# Patient Record
Sex: Male | Born: 1970 | Race: Black or African American | Hispanic: No | Marital: Married | Smoking: Never smoker
Health system: Southern US, Community
[De-identification: ages and names within clinical notes are randomized; demographics above are authoritative.]

## PROBLEM LIST (undated history)

## (undated) DIAGNOSIS — Z8669 Personal history of other diseases of the nervous system and sense organs: Secondary | ICD-10-CM

## (undated) DIAGNOSIS — J45909 Unspecified asthma, uncomplicated: Secondary | ICD-10-CM

## (undated) DIAGNOSIS — N201 Calculus of ureter: Secondary | ICD-10-CM

## (undated) DIAGNOSIS — N289 Disorder of kidney and ureter, unspecified: Secondary | ICD-10-CM

## (undated) DIAGNOSIS — Z973 Presence of spectacles and contact lenses: Secondary | ICD-10-CM

## (undated) HISTORY — DX: Presence of spectacles and contact lenses: Z97.3

## (undated) HISTORY — DX: Unspecified asthma, uncomplicated: J45.909

---

## 1994-03-01 HISTORY — PX: NASAL FRACTURE SURGERY: SHX718

## 2005-05-09 ENCOUNTER — Emergency Department (HOSPITAL_COMMUNITY): Admission: EM | Admit: 2005-05-09 | Discharge: 2005-05-09 | Payer: Self-pay | Admitting: Family Medicine

## 2008-04-19 ENCOUNTER — Ambulatory Visit: Payer: Self-pay | Admitting: Family Medicine

## 2009-02-24 ENCOUNTER — Ambulatory Visit: Payer: Self-pay | Admitting: Family Medicine

## 2010-07-20 ENCOUNTER — Ambulatory Visit: Payer: Self-pay | Admitting: Family Medicine

## 2010-07-21 ENCOUNTER — Ambulatory Visit (INDEPENDENT_AMBULATORY_CARE_PROVIDER_SITE_OTHER): Payer: BC Managed Care – PPO | Admitting: Family Medicine

## 2010-07-21 ENCOUNTER — Encounter: Payer: Self-pay | Admitting: Family Medicine

## 2010-07-21 DIAGNOSIS — J45909 Unspecified asthma, uncomplicated: Secondary | ICD-10-CM

## 2010-07-21 DIAGNOSIS — J309 Allergic rhinitis, unspecified: Secondary | ICD-10-CM

## 2010-07-21 MED ORDER — ALBUTEROL 90 MCG/ACT IN AERS: 2.0000 | INHALATION_SPRAY | Freq: Four times a day (QID) | RESPIRATORY_TRACT | Status: DC | PRN

## 2010-07-21 NOTE — Patient Instructions (Signed)
use the inhaler as needed . If you keep having trouble, return here for reevaluation

## 2010-07-21 NOTE — Progress Notes (Signed)
  Subjective:    Patient ID: [name], male    DOB: 1970/04/24, 40 y.o.   MRN: [id number]  HPI approximately 2 months ago he started having trouble with sneezing, itchy watery eyes, runny nose occurs every spring and fall. He started having difficulty with coughing shortly after that and noted occasional wheezing. No fever or chills or productive cough. He does not smoke    Review of Systems     Objective:   Physical Exam  Constitutional: No distress.  HENT:  Right Ear: External ear normal.  Left Ear: External ear normal.  Nose: Nose normal.  Mouth/Throat: Oropharynx is clear and moist. No oropharyngeal exudate.  Neck: Neck supple. No thyromegaly present.  Cardiovascular: Normal rate, regular rhythm and normal heart sounds.  Exam reveals no gallop and no friction rub.   No murmur heard. Pulmonary/Chest: Breath sounds normal. No respiratory distress. He has no wheezes. He has no rales.  Lymphadenopathy:    He has no cervical adenopathy.   At the end of the encounter he then mentioned difficulty with questionable scrotal lesion. Exam of the penis, testes and scrotum with normal       Assessment & Plan:  Allergic rhinitis. Asthma. Proventil for the asthma. Return here if further trouble

## 2011-06-03 ENCOUNTER — Encounter: Payer: Self-pay | Admitting: Internal Medicine

## 2011-06-04 ENCOUNTER — Encounter: Payer: Self-pay | Admitting: Medical

## 2011-06-04 ENCOUNTER — Ambulatory Visit (INDEPENDENT_AMBULATORY_CARE_PROVIDER_SITE_OTHER): Payer: BC Managed Care – PPO | Admitting: Medical

## 2011-06-04 VITALS — BP 112/70 | HR 92 | Temp 99.1°F | Resp 16 | Wt 159.0 lb

## 2011-06-04 DIAGNOSIS — J329 Chronic sinusitis, unspecified: Secondary | ICD-10-CM

## 2011-06-04 MED ORDER — AMOXICILLIN 875 MG PO TABS: 875.0000 mg | ORAL_TABLET | Freq: Two times a day (BID) | ORAL | Status: AC

## 2011-06-04 NOTE — Patient Instructions (Signed)

## 2011-06-04 NOTE — Progress Notes (Signed)
Subjective:  [name] is a 41 y.o. male who presents for possible sinus infection.  Symptoms began 5 days ago with head congestion, fever, ear pain, colored drainage, some cough, using Tylenol for symptoms.  No sick contacts.   Patient is a non-smoker.  No other aggravating or relieving factors.  No other c/o.  Past Medical History  Diagnosis Date  . Elevated lipids   . Allergic rhinitis    ROS as noted in HPI  Objective:   Filed Vitals:   06/04/11 1109  BP: 112/70  Pulse: 92  Temp: 99.1 F (37.3 C)  Resp: 16    General appearance: Alert, WD/WN, no distress                             Skin: warm, no rash                           Head: + frontal sinus tenderness,                            Eyes: conjunctiva normal, corneas clear, PERRLA                            Ears: pearly TMs, external ear canals normal                          Nose: septum midline, turbinates swollen, with erythema and clear discharge             Mouth/throat: MMM, tongue normal, mild pharyngeal erythema                           Neck: supple, no adenopathy, no thyromegaly, nontender                          Heart: RRR, normal S1, S2, no murmurs                         Lungs: CTA bilaterally, no wheezes, rales, or rhonchi      Assessment and Plan:   Encounter Diagnosis  Name Primary?  . Sinusitis Yes   Prescription given for Amoxicillin.  Can use OTC Mucinex DM for congestion.  Tylenol or Ibuprofen OTC for fever and malaise.  Discussed symptomatic relief, nasal saline, and call or return if worse or not improving in 2-3 days.

## 2011-06-05 ENCOUNTER — Encounter: Payer: Self-pay | Admitting: Medical

## 2011-06-08 ENCOUNTER — Ambulatory Visit (INDEPENDENT_AMBULATORY_CARE_PROVIDER_SITE_OTHER): Payer: BC Managed Care – PPO | Admitting: Medical

## 2011-06-08 ENCOUNTER — Encounter: Payer: Self-pay | Admitting: Medical

## 2011-06-08 ENCOUNTER — Other Ambulatory Visit: Payer: Self-pay | Admitting: Medical

## 2011-06-08 VITALS — BP 112/82 | HR 68 | Temp 98.4°F | Resp 16 | Ht 64.0 in | Wt 157.0 lb

## 2011-06-08 DIAGNOSIS — Z23 Encounter for immunization: Secondary | ICD-10-CM

## 2011-06-08 DIAGNOSIS — Z125 Encounter for screening for malignant neoplasm of prostate: Secondary | ICD-10-CM

## 2011-06-08 DIAGNOSIS — R3129 Other microscopic hematuria: Secondary | ICD-10-CM

## 2011-06-08 DIAGNOSIS — Z0000 Encounter for general adult medical examination without abnormal findings: Secondary | ICD-10-CM

## 2011-06-08 DIAGNOSIS — J309 Allergic rhinitis, unspecified: Secondary | ICD-10-CM

## 2011-06-08 LAB — POCT URINALYSIS DIPSTICK
Bilirubin, UA: NEGATIVE
Glucose, UA: NEGATIVE
Ketones, UA: NEGATIVE
Spec Grav, UA: 1.015
pH, UA: 5

## 2011-06-08 LAB — COMPREHENSIVE METABOLIC PANEL
AST: 27 U/L (ref 0–37)
Albumin: 4.2 g/dL (ref 3.5–5.2)
BUN: 12 mg/dL (ref 6–23)
CO2: 27 mEq/L (ref 19–32)
Calcium: 8.8 mg/dL (ref 8.4–10.5)
Chloride: 104 mEq/L (ref 96–112)
Creat: 1.12 mg/dL (ref 0.50–1.35)
Glucose, Bld: 112 mg/dL — ABNORMAL HIGH (ref 70–99)
Potassium: 3.9 mEq/L (ref 3.5–5.3)

## 2011-06-08 LAB — CBC WITH DIFFERENTIAL/PLATELET
Basophils Relative: 0 % (ref 0–1)
HCT: 47.6 % (ref 39.0–52.0)
Hemoglobin: 16.7 g/dL (ref 13.0–17.0)
Lymphocytes Relative: 28 % (ref 12–46)
MCHC: 35.1 g/dL (ref 30.0–36.0)
Monocytes Relative: 5 % (ref 3–12)
Neutro Abs: 3 10*3/uL (ref 1.7–7.7)
Neutrophils Relative %: 65 % (ref 43–77)
RBC: 6.45 MIL/uL — ABNORMAL HIGH (ref 4.22–5.81)
WBC: 4.6 10*3/uL (ref 4.0–10.5)

## 2011-06-08 LAB — LIPID PANEL
Cholesterol: 184 mg/dL (ref 0–200)
HDL: 30 mg/dL — ABNORMAL LOW (ref >39)
Triglycerides: 188 mg/dL — ABNORMAL HIGH (ref <150)
VLDL: 38 mg/dL (ref 0–40)

## 2011-06-08 NOTE — Progress Notes (Signed)
Subjective:   HPI  [name] is a 41 y.o. male who presents for a complete physical.  I saw him last week for sinusitis and he is improving.   He has been in his normal state of health otherwise without c/o.  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   Last opthalmology visit 8months ago Last dental visit 5-6 years ago Last tdap unknown Declines flu shot  Reviewed their medical, surgical, family, social, medication, and allergy history and updated chart as appropriate.  Past Medical History  Diagnosis Date  . Allergic rhinitis   . Allergic asthma   . Wears glasses     Past Surgical History  Procedure Date  . Nasal fracture surgery     Family History  Problem Relation Age of Onset  . Diabetes Mother   . Diabetes Father   . Hypertension Father   . Other Father     chemical exposure in [place]  . Stroke Neg Hx   . Heart disease Neg Hx   . Cancer Neg Hx   . Hyperlipidemia Neg Hx     History   Social History  . Marital Status: Married    Spouse Name: N/A    Number of Children: N/A  . Years of Education: N/A   Occupational History  . social studies teacher grade 7 [employer]   Social History Main Topics  . Smoking status: Never Smoker   . Smokeless tobacco: Never Used  . Alcohol Use: No  . Drug Use: No  . Sexually Active: Not on file   Other Topics Concern  . Not on file   Social History Narrative   Married, 4 children, exercise some with walking and stretching    Current Outpatient Prescriptions on File Prior to Visit  Medication Sig Dispense Refill  . albuterol (PROVENTIL) 90 MCG/ACT inhaler Inhale 2 puffs into the lungs every 6 (six) hours as needed for wheezing.  17 g  0  . amoxicillin (AMOXIL) 875 MG tablet Take 1 tablet (875 mg total) by mouth 2 (two) times daily.  20 tablet  0    No Known Allergies  Review of Systems Constitutional: -fever, -chills, -sweats, -unexpected weight change, -anorexia, -fatigue Allergy:  -sneezing, -itching, -congestion Dermatology: denies changing moles, rash, lumps, new worrisome lesions ENT: -runny nose, -ear pain, -sore throat, -hoarseness, -sinus pain, -teeth pain, -tinnitus, -hearing loss, -epistaxis Cardiology:  -chest pain, -palpitations, -edema, -orthopnea, -paroxysmal nocturnal dyspnea Respiratory: -cough, -shortness of breath, -dyspnea on exertion, -wheezing, -hemoptysis Gastroenterology: -abdominal pain, -nausea, -vomiting, -diarrhea, -constipation, -blood in stool, -changes in bowel movement, -dysphagia Hematology: -bleeding or bruising problems Musculoskeletal: -arthralgias, -myalgias, -joint swelling, -back pain, -neck pain, -cramping, -gait changes Ophthalmology: -vision changes, -eye redness, -itching, -discharge Urology: -dysuria, -difficulty urinating, -hematuria, -urinary frequency, -urgency, incontinence Neurology: -headache, -weakness, -tingling, -numbness, -speech abnormality, -memory loss, -falls, -dizziness Psychology:  -depressed mood, -agitation, -sleep problems     Objective:   Physical Exam  Filed Vitals:   06/08/11 0804  BP: 112/82  Pulse: 68  Temp: 98.4 F (36.9 C)  Resp: 16    General appearance: alert, no distress, WD/WN, black male Skin: few scatterd benign appearing macules, no worrisome lesions HEENT: normocephalic, conjunctiva/corneas normal, sclerae anicteric, PERRLA, EOMi, nares patent, no discharge or erythema, pharynx normal Oral cavity: MMM, tongue normal, teeth in good repair Neck: supple, no lymphadenopathy, no thyromegaly, no masses, normal ROM, no bruits Chest: non tender, normal shape and expansion Heart: RRR, normal S1, S2, no  murmurs Lungs: CTA bilaterally, no wheezes, rhonchi, or rales Abdomen: +bs, soft, non tender, non distended, no masses, no hepatomegaly, no splenomegaly, no bruits Back: non tender, normal ROM, no scoliosis Musculoskeletal: upper extremities non tender, no obvious deformity, normal ROM  throughout, lower extremities non tender, no obvious deformity, normal ROM throughout Extremities: no edema, no cyanosis, no clubbing Pulses: 2+ symmetric, upper and lower extremities, normal cap refill Neurological: alert, oriented x 3, CN2-12 intact, strength normal upper extremities and lower extremities, sensation normal throughout, DTRs 2+ throughout, no cerebellar signs, gait normal Psychiatric: normal affect, behavior normal, pleasant  GU: normal male external genitalia, nontender, no masses, no hernia, no lymphadenopathy Rectal: anus with small external hemorrhoid, unable to get good palpation of prostate due to patient guarding   Assessment and Plan :    Encounter Diagnoses  Name Primary?  . Routine general medical examination at a health care facility Yes  . Screening PSA (prostate specific antigen)   . Need for Tdap vaccination   . Allergic rhinitis   . Microscopic hematuria    Physical exam - healthy appearing.  Discussed healthy lifestyle, diet, exercise, preventative care, vaccinations, and addressed their concerns.  Handout given.  PSA screening today.  Discussed risks/benefits of testing.  Tdap given today along with VIS and vaccine counseling.    Allergic rhinitis - begin OTC Zyrtec 10mg  QHS  Microscopic hematuria - recheck 2 wk first morning urine.  Follow-up pending labs.

## 2011-06-08 NOTE — Patient Instructions (Signed)
Preventative Care for Adults, Male       REGULAR HEALTH EXAMS:  A routine yearly physical is a good way to check in with your primary care provider about your health and preventive screening. It is also an opportunity to share updates about your health and any concerns you have, and receive a thorough all-over exam.   Most health insurance companies pay for at least some preventative services.  Check with your health plan for specific coverages.  WHAT PREVENTATIVE SERVICES DO MEN NEED?  Adult men should have their weight and blood pressure checked regularly.   Men age 35 and older should have their cholesterol levels checked regularly.  Beginning at age 50 and continuing to age 75, men should be screened for colorectal cancer.  Certain people should may need continued testing until age 85.  Other cancer screening may include exams for testicular and prostate cancer.  Updating vaccinations is part of preventative care.  Vaccinations help protect against diseases such as the flu.  Lab tests are generally done as part of preventative care to screen for anemia and blood disorders, to screen for problems with the kidneys and liver, to screen for bladder problems, to check blood sugar, and to check your cholesterol level.  Preventative services generally include counseling about diet, exercise, avoiding tobacco, drugs, excessive alcohol consumption, and sexually transmitted infections.    GENERAL RECOMMENDATIONS FOR GOOD HEALTH:  Healthy diet:  Eat a variety of foods, including fruit, vegetables, animal or vegetable protein, such as meat, fish, chicken, and eggs, or beans, lentils, tofu, and grains, such as rice.  Drink plenty of water daily.  Decrease saturated fat in the diet, avoid lots of red meat, processed foods, sweets, fast foods, and fried foods.  Exercise:  Aerobic exercise helps maintain good heart health. At least 30-40 minutes of moderate-intensity exercise is recommended.  For example, a brisk walk that increases your heart rate and breathing. This should be done on most days of the week.   Find a type of exercise or a variety of exercises that you enjoy so that it becomes a part of your daily life.  Examples are running, walking, swimming, water aerobics, and biking.  For motivation and support, explore group exercise such as aerobic class, spin class, Zumba, Yoga,or  martial arts, etc.    Set exercise goals for yourself, such as a certain weight goal, walk or run in a race such as a 5k walk/run.  Speak to your primary care provider about exercise goals.  Disease prevention:  If you smoke or chew tobacco, find out from your caregiver how to quit. It can literally save your life, no matter how long you have been a tobacco user. If you do not use tobacco, never begin.   Maintain a healthy diet and normal weight. Increased weight leads to problems with blood pressure and diabetes.   The Body Mass Index or BMI is a way of measuring how much of your body is fat. Having a BMI above 27 increases the risk of heart disease, diabetes, hypertension, stroke and other problems related to obesity. Your caregiver can help determine your BMI and based on it develop an exercise and dietary program to help you achieve or maintain this important measurement at a healthful level.  High blood pressure causes heart and blood vessel problems.  Persistent high blood pressure should be treated with medicine if weight loss and exercise do not work.   Fat and cholesterol leaves deposits in your arteries   that can block them. This causes heart disease and vessel disease elsewhere in your body.  If your cholesterol is found to be high, or if you have heart disease or certain other medical conditions, then you may need to have your cholesterol monitored frequently and be treated with medication.   Ask if you should have a stress test if your history suggests this. A stress test is a test done on  a treadmill that looks for heart disease. This test can find disease prior to there being a problem.  Avoid drinking alcohol in excess (more than two drinks per day).  Avoid use of street drugs. Do not share needles with anyone. Ask for professional help if you need assistance or instructions on stopping the use of alcohol, cigarettes, and/or drugs.  Brush your teeth twice a day with fluoride toothpaste, and floss once a day. Good oral hygiene prevents tooth decay and gum disease. The problems can be painful, unattractive, and can cause other health problems. Visit your dentist for a routine oral and dental check up and preventive care every 6-12 months.   Look at your skin regularly.  Use a mirror to look at your back. Notify your caregivers of changes in moles, especially if there are changes in shapes, colors, a size larger than a pencil eraser, an irregular border, or development of new moles.  Safety:  Use seatbelts 100% of the time, whether driving or as a passenger.  Use safety devices such as hearing protection if you work in environments with loud noise or significant background noise.  Use safety glasses when doing any work that could send debris in to the eyes.  Use a helmet if you ride a bike or motorcycle.  Use appropriate safety gear for contact sports.  Talk to your caregiver about gun safety.  Use sunscreen with a SPF (or skin protection factor) of 15 or greater.  Lighter skinned people are at a greater risk of skin cancer. Don't forget to also wear sunglasses in order to protect your eyes from too much damaging sunlight. Damaging sunlight can accelerate cataract formation.   Practice safe sex. Use condoms. Condoms are used for birth control and to help reduce the spread of sexually transmitted infections (or STIs).  Some of the STIs are gonorrhea (the clap), chlamydia, syphilis, trichomonas, herpes, HPV (human papilloma virus) and HIV (human immunodeficiency virus) which causes AIDS.  The herpes, HIV and HPV are viral illnesses that have no cure. These can result in disability, cancer and death.   Keep carbon monoxide and smoke detectors in your home functioning at all times. Change the batteries every 6 months or use a model that plugs into the wall.   Vaccinations:  Stay up to date with your tetanus shots and other required immunizations. You should have a booster for tetanus every 10 years. Be sure to get your flu shot every year, since 5%-20% of the [place] population comes down with the flu. The flu vaccine changes each year, so being vaccinated once is not enough. Get your shot in the fall, before the flu season peaks.   Other vaccines to consider:  Pneumococcal vaccine to protect against certain types of pneumonia.  This is normally recommended for adults age 65 or older.  However, adults younger than 42 years old with certain underlying conditions such as diabetes, heart or lung disease should also receive the vaccine.  Shingles vaccine to protect against Varicella Zoster if you are older than age 60, or younger   than 42 years old with certain underlying illness.  Hepatitis A vaccine to protect against a form of infection of the liver by a virus acquired from food.  Hepatitis B vaccine to protect against a form of infection of the liver by a virus acquired from blood or body fluids, particularly if you work in health care.  If you plan to travel internationally, check with your local health department for specific vaccination recommendations.  Cancer Screening:  Most routine colon cancer screening begins at the age of 50. On a yearly basis, doctors may provide special easy to use take-home tests to check for hidden blood in the stool. Sigmoidoscopy or colonoscopy can detect the earliest forms of colon cancer and is life saving. These tests use a small camera at the end of a tube to directly examine the colon. Speak to your caregiver about this at age 50, when routine  screening begins (and is repeated every 5 years unless early forms of pre-cancerous polyps or small growths are found).   At the age of 50 men usually start screening for prostate cancer every year. Screening may begin at a younger age for those with higher risk. Those at higher risk include African-Americans or having a family history of prostate cancer. There are two types of tests for prostate cancer:   Prostate-specific antigen (PSA) testing. Recent studies raise questions about prostate cancer using PSA and you should discuss this with your caregiver.   Digital rectal exam (in which your doctor's lubricated and gloved finger feels for enlargement of the prostate through the anus).   Screening for testicular cancer.  Do a monthly exam of your testicles. Gently roll each testicle between your thumb and fingers, feeling for any abnormal lumps. The best time to do this is after a hot shower or bath when the tissues are looser. Notify your caregivers of any lumps, tenderness or changes in size or shape immediately.     

## 2011-06-09 LAB — HEMOGLOBIN A1C: Mean Plasma Glucose: 117 mg/dL — ABNORMAL HIGH (ref <117)

## 2011-06-22 ENCOUNTER — Other Ambulatory Visit (INDEPENDENT_AMBULATORY_CARE_PROVIDER_SITE_OTHER): Payer: BC Managed Care – PPO

## 2011-06-22 DIAGNOSIS — R319 Hematuria, unspecified: Secondary | ICD-10-CM

## 2011-06-22 LAB — POCT URINALYSIS DIPSTICK
Bilirubin, UA: NEGATIVE
Blood, UA: 250
Glucose, UA: NEGATIVE
Ketones, UA: NEGATIVE
Leukocytes, UA: NEGATIVE
Nitrite, UA: NEGATIVE

## 2013-07-17 ENCOUNTER — Encounter: Payer: Self-pay | Admitting: Medical

## 2013-07-18 ENCOUNTER — Encounter: Payer: Self-pay | Admitting: Family Medicine

## 2015-03-02 DIAGNOSIS — N201 Calculus of ureter: Secondary | ICD-10-CM

## 2015-03-02 HISTORY — DX: Calculus of ureter: N20.1

## 2015-05-27 ENCOUNTER — Encounter: Payer: Self-pay | Admitting: Family Medicine

## 2015-05-27 ENCOUNTER — Ambulatory Visit (INDEPENDENT_AMBULATORY_CARE_PROVIDER_SITE_OTHER): Payer: BC Managed Care – PPO | Admitting: Family Medicine

## 2015-05-27 VITALS — BP 112/76 | HR 68 | Resp 12 | Ht 64.0 in | Wt 162.4 lb

## 2015-05-27 DIAGNOSIS — J4520 Mild intermittent asthma, uncomplicated: Secondary | ICD-10-CM | POA: Diagnosis not present

## 2015-05-27 DIAGNOSIS — Z0000 Encounter for general adult medical examination without abnormal findings: Secondary | ICD-10-CM

## 2015-05-27 DIAGNOSIS — J301 Allergic rhinitis due to pollen: Secondary | ICD-10-CM

## 2015-05-27 LAB — POCT URINALYSIS DIP (MANUAL ENTRY)
Bilirubin, UA: NEGATIVE
Glucose, UA: NEGATIVE
Ketones, POC UA: NEGATIVE
Leukocytes, UA: NEGATIVE
Nitrite, UA: NEGATIVE
PROTEIN UA: NEGATIVE
UROBILINOGEN UA: 0.2
pH, UA: 5.5

## 2015-05-27 LAB — CBC WITH DIFFERENTIAL/PLATELET
BASOS PCT: 0 % (ref 0–1)
Basophils Absolute: 0 10*3/uL (ref 0.0–0.1)
Eosinophils Absolute: 0.2 10*3/uL (ref 0.0–0.7)
Eosinophils Relative: 3 % (ref 0–5)
HEMATOCRIT: 49 % (ref 39.0–52.0)
HEMOGLOBIN: 17.6 g/dL — AB (ref 13.0–17.0)
LYMPHS PCT: 30 % (ref 12–46)
Lymphs Abs: 1.7 10*3/uL (ref 0.7–4.0)
MCH: 27.2 pg (ref 26.0–34.0)
MCHC: 35.9 g/dL (ref 30.0–36.0)
MCV: 75.7 fL — ABNORMAL LOW (ref 78.0–100.0)
MONO ABS: 0.5 10*3/uL (ref 0.1–1.0)
MONOS PCT: 9 % (ref 3–12)
MPV: 9.4 fL (ref 8.6–12.4)
NEUTROS ABS: 3.2 10*3/uL (ref 1.7–7.7)
NEUTROS PCT: 58 % (ref 43–77)
Platelets: 236 10*3/uL (ref 150–400)
RBC: 6.47 MIL/uL — ABNORMAL HIGH (ref 4.22–5.81)
RDW: 14 % (ref 11.5–15.5)
WBC: 5.6 10*3/uL (ref 4.0–10.5)

## 2015-05-27 NOTE — Progress Notes (Signed)
   Subjective:    Patient ID: [name], male    DOB: 03/30/1970, 45 y.o.   MRN: [id number]  HPI He is here for complete examination. He does have underlying allergies and has them under good control. He has remote history of difficulty with asthma and rarely uses albuterol. He has no other concerns or complaints. 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 They very much enjoy this. He does need paperwork filled out for this. Family and social history as well as health maintenance and immunizations were reviewed.   Review of Systems  All other systems reviewed and are negative.      Objective:   Physical Exam BP 112/76 mmHg  Pulse 68  Resp 12  Ht 5\' 4"  (1.626 m)  Wt 162 lb 6.4 oz (73.664 kg)  BMI 27.86 kg/m2  General Appearance:    Alert, cooperative, no distress, appears stated age  Head:    Normocephalic, without obvious abnormality, atraumatic  Eyes:    PERRL, conjunctiva/corneas clear, EOM's intact, fundi    benign  Ears:    Normal TM's and external ear canals  Nose:   Nares normal, mucosa normal, no drainage or sinus   tenderness  Throat:   Lips, mucosa, and tongue normal; teeth and gums normal  Neck:   Supple, no lymphadenopathy;  thyroid:  no   enlargement/tenderness/nodules; no carotid   bruit or JVD  Back:    Spine nontender, no curvature, ROM normal, no CVA     tenderness  Lungs:     Clear to auscultation bilaterally without wheezes, rales or     ronchi; respirations unlabored  Chest Wall:    No tenderness or deformity   Heart:    Regular rate and rhythm, S1 and S2 normal, no murmur, rub   or gallop     Abdomen:     Soft, non-tender, nondistended, normoactive bowel sounds,    no masses, no hepatosplenomegaly  Genitalia:    Normal male external genitalia without lesions.  Testicles without masses.  No inguinal hernias.     Extremities:   No clubbing, cyanosis or edema    Pulses:   2+ and symmetric all extremities  Skin:   Skin color, texture, turgor normal, no rashes or lesions  Lymph nodes:   Cervical, supraclavicular, and axillary nodes normal  Neurologic:   CNII-XII intact, normal strength, sensation and gait; reflexes 2+ and symmetric throughout          Psych:   Normal mood, affect, hygiene and grooming.          Assessment & Plan:  Annual physical exam - Plan: POCT urinalysis dipstick, CBC with Differential/Platelet, Comprehensive metabolic panel, Lipid panel  Allergic rhinitis due to pollen  Asthma, mild intermittent, uncomplicated increased him to continue to take good care of himself. Discussed the use of albuterol in the future. He rarely uses this though I do not see this as a problem.

## 2015-05-28 LAB — LIPID PANEL
CHOLESTEROL: 221 mg/dL — AB (ref 125–200)
HDL: 41 mg/dL (ref >=40)
LDL Cholesterol: 153 mg/dL — ABNORMAL HIGH (ref <130)
TRIGLYCERIDES: 133 mg/dL (ref <150)
Total CHOL/HDL Ratio: 5.4 Ratio — ABNORMAL HIGH (ref <=5.0)
VLDL: 27 mg/dL (ref <30)

## 2015-05-28 LAB — COMPREHENSIVE METABOLIC PANEL
ALBUMIN: 4.2 g/dL (ref 3.6–5.1)
ALT: 19 U/L (ref 9–46)
AST: 18 U/L (ref 10–40)
Alkaline Phosphatase: 83 U/L (ref 40–115)
BILIRUBIN TOTAL: 0.8 mg/dL (ref 0.2–1.2)
BUN: 10 mg/dL (ref 7–25)
CALCIUM: 9.1 mg/dL (ref 8.6–10.3)
CHLORIDE: 106 mmol/L (ref 98–110)
CO2: 25 mmol/L (ref 20–31)
CREATININE: 1.37 mg/dL — AB (ref 0.60–1.35)
Glucose, Bld: 79 mg/dL (ref 65–99)
Potassium: 4.1 mmol/L (ref 3.5–5.3)
SODIUM: 139 mmol/L (ref 135–146)
TOTAL PROTEIN: 7.2 g/dL (ref 6.1–8.1)

## 2016-01-01 ENCOUNTER — Emergency Department (HOSPITAL_COMMUNITY)
Admission: EM | Admit: 2016-01-01 | Discharge: 2016-01-01 | Disposition: A | Discharge status: Home / Self Care | Payer: BC Managed Care – PPO | Attending: Emergency Medicine | Admitting: Emergency Medicine

## 2016-01-01 ENCOUNTER — Emergency Department (HOSPITAL_COMMUNITY): Payer: BC Managed Care – PPO

## 2016-01-01 ENCOUNTER — Encounter (HOSPITAL_COMMUNITY): Payer: Self-pay

## 2016-01-01 DIAGNOSIS — J45909 Unspecified asthma, uncomplicated: Secondary | ICD-10-CM | POA: Insufficient documentation

## 2016-01-01 DIAGNOSIS — K5900 Constipation, unspecified: Secondary | ICD-10-CM | POA: Diagnosis not present

## 2016-01-01 DIAGNOSIS — R109 Unspecified abdominal pain: Secondary | ICD-10-CM

## 2016-01-01 DIAGNOSIS — R1032 Left lower quadrant pain: Secondary | ICD-10-CM | POA: Diagnosis present

## 2016-01-01 MED ORDER — FLEET ENEMA 7-19 GM/118ML RE ENEM
1.0000 | ENEMA | Freq: Once | RECTAL | Status: AC
  Administered 2016-01-01: 1 via RECTAL
  Filled 2016-01-01: qty 1

## 2016-01-01 MED ORDER — IBUPROFEN 800 MG PO TABS
800.0000 mg | ORAL_TABLET | Freq: Once | ORAL | Status: AC
  Administered 2016-01-01: 800 mg via ORAL
  Filled 2016-01-01: qty 1

## 2016-01-01 MED ORDER — POLYETHYLENE GLYCOL 3350 17 G PO PACK: 17.0000 g | PACK | Freq: Every day | ORAL | 0 refills | Status: DC

## 2016-01-01 NOTE — Discharge Instructions (Signed)
Take miralax 2-3 times daily until you have a bowel movement then daily until your stool is soft for a week.   See your doctor. Consider see GI doctor if you are still constipated in a week.   Return to ER if you have fever, severe pain, testicular pain, vomiting, unable to have bowel movement for a week.

## 2016-01-01 NOTE — ED Triage Notes (Signed)
Pt c/o LLQ ABD pain x5 days that has gotten progressively worse. Pt describes pain as sharp. Pt cannot recall the last time he had a BM.

## 2016-01-01 NOTE — ED Provider Notes (Signed)
[facility]-EMERGENCY DEPT Provider Note   CSN: [id number] Arrival date & time: 01/01/16  0944     History   Chief Complaint Chief Complaint  Patient presents with  . Abdominal Pain    HPI [name] is a 45 y.o. male here with abdominal pain. Has been constipated for the last 5 days. Has progressive LLQ pain that is gradually getting worse. Denies any urinary symptoms or vomiting or fevers. Denies testicular pain. No hx of constipation.   The history is provided by the patient.    Past Medical History:  Diagnosis Date  . Allergic asthma   . Allergic rhinitis   . Wears glasses     Patient Active Problem List   Diagnosis Date Noted  . Allergic rhinitis 06/08/2011    Past Surgical History:  Procedure Laterality Date  . NASAL FRACTURE SURGERY         Home Medications    Prior to Admission medications   Medication Sig Start Date End Date Taking? Authorizing Provider  albuterol (PROVENTIL) 90 MCG/ACT inhaler Inhale 2 puffs into the lungs every 6 (six) hours as needed for wheezing. 07/21/10 07/16/11  Ronnald NianJohn C Lalonde, MD    Family History Family History  Problem Relation Age of Onset  . Diabetes Mother   . Diabetes Father   . Hypertension Father   . Other Father     chemical exposure in [place]  . Stroke Neg Hx   . Heart disease Neg Hx   . Cancer Neg Hx   . Hyperlipidemia Neg Hx     Social History Social History  Substance Use Topics  . Smoking status: Never Smoker  . Smokeless tobacco: Never Used  . Alcohol use No     Allergies   Pollen extract and Shellfish allergy   Review of Systems Review of Systems  Gastrointestinal: Positive for abdominal pain.  All other systems reviewed and are negative.    Physical Exam Updated Vital Signs BP 133/96 (BP Location: Right Arm)   Pulse 81   Temp 98 F (36.7 C) (Oral)   Resp 21   Ht 5\' 4"  (1.626 m)   Wt 160 lb (72.6 kg)   SpO2 97%   BMI 27.46 kg/m   Physical Exam  Constitutional: He appears  well-developed and well-nourished.  HENT:  Head: Normocephalic.  Eyes: EOM are normal.  Neck: Normal range of motion. Neck supple.  Cardiovascular: Normal rate, regular rhythm and normal heart sounds.   Pulmonary/Chest: Effort normal and breath sounds normal. No respiratory distress. He has no wheezes. He has no rales.  Abdominal: Soft. Bowel sounds are normal.  Mild LLQ tenderness, no rebound   Genitourinary:  Genitourinary Comments: Rectal- no obvious stool impaction. Testicles nontender   Musculoskeletal: Normal range of motion.  Neurological: He is alert.  Skin: Skin is warm.  Psychiatric: He has a normal mood and affect.  Nursing note and vitals reviewed.    ED Treatments / Results  Labs (all labs ordered are listed, but only abnormal results are displayed) Labs Reviewed - No data to display  EKG  EKG Interpretation  Date/Time:  Thursday January 01 2016 09:54:32 EDT Ventricular Rate:  80 PR Interval:    QRS Duration: 87 QT Interval:  372 QTC Calculation: 430 R Axis:   66 Text Interpretation:  Sinus rhythm Baseline wander in lead(s) V3 No previous ECGs available Confirmed by Latoiya Maradiaga  MD, Elbridge Magowan ([id number]) on 01/01/2016 9:58:54 AM       Radiology No results found.  Procedures Procedures (including critical care time)  Medications Ordered in ED Medications  sodium phosphate (FLEET) 7-19 GM/118ML enema 1 enema (not administered)  ibuprofen (ADVIL,MOTRIN) tablet 800 mg (not administered)     Initial Impression / Assessment and Plan / ED Course  I have reviewed the triage vital signs and the nursing notes.  Pertinent labs & imaging results that were available during my care of the patient were reviewed by me and considered in my medical decision making (see chart for details).  Clinical Course    [name] is a 45 y.o. male here with constipation, LLQ pain. Minimal LLQ tenderness. No fever or urinary symptoms or vomiting. Well appearing overall. Will get xray,  give enema.   11:59 AM Given enema but had no bowel movement. Xray showed constipation throughout likely causing his symptoms. Low suspicion for diverticulitis. Comfortable after given motrin. Recommend miralax, GI follow up.    Final Clinical Impressions(s) / ED Diagnoses   Final diagnoses:  Abdominal pain    New Prescriptions New Prescriptions   No medications on file     Charlynne Panderavid Hsienta Rita Prom, MD 01/01/16 1200

## 2016-01-02 ENCOUNTER — Telehealth: Payer: Self-pay

## 2016-01-02 NOTE — Telephone Encounter (Signed)
I reviewed the ED note, xray.  Sounds like he may just be backed up.  He could try saline enema OTC, but I would try Milk of Magnesia OTC as this usually will unstop someone  However, if significant abdominal pain, then get checked back out.  There was no sign of obstruction of bowel on the xray the other day

## 2016-01-02 NOTE — Telephone Encounter (Signed)
Pt's wife called back  Told her to try the saline  Enema , the milk magnesia  To see if that will help , she was concern if he wouldn't will know work .she was told per shane call go to hospital , or call the afterhours line, also asked about his back issues, pt is not having any numbness in his legs, and had not taking any pain meds for his back .and follow up

## 2016-01-02 NOTE — Telephone Encounter (Signed)
Pt 's wife and said that he has not had BM since last week and he when to the ER on Thursday 01/01/2016  They did an x-ray an showed  Large stool, give him a  edema  nothing happen , also he was given mira lax try  5 times, and nothing has happen. She look on line see if she could find out anything. She said something about a back injury can cause this, he did hurt his back on Saturday moving trail could this be causes the problem.

## 2016-01-02 NOTE — Telephone Encounter (Signed)
Called patient back I called his wife's cell phone, her work phone, cell phone, also the patient cell phone.

## 2016-01-07 ENCOUNTER — Other Ambulatory Visit: Payer: Self-pay | Admitting: Gastroenterology

## 2016-01-07 DIAGNOSIS — K921 Melena: Secondary | ICD-10-CM

## 2016-01-07 DIAGNOSIS — R634 Abnormal weight loss: Secondary | ICD-10-CM

## 2016-01-07 DIAGNOSIS — R1032 Left lower quadrant pain: Secondary | ICD-10-CM

## 2016-01-08 ENCOUNTER — Encounter: Payer: Self-pay | Admitting: Family Medicine

## 2016-01-08 ENCOUNTER — Ambulatory Visit (INDEPENDENT_AMBULATORY_CARE_PROVIDER_SITE_OTHER): Payer: BC Managed Care – PPO | Admitting: Family Medicine

## 2016-01-08 VITALS — BP 120/80 | HR 110 | Wt 158.0 lb

## 2016-01-08 DIAGNOSIS — K5900 Constipation, unspecified: Secondary | ICD-10-CM | POA: Diagnosis not present

## 2016-01-08 DIAGNOSIS — R1012 Left upper quadrant pain: Secondary | ICD-10-CM

## 2016-01-08 DIAGNOSIS — R319 Hematuria, unspecified: Secondary | ICD-10-CM

## 2016-01-08 LAB — POCT URINALYSIS DIPSTICK
Bilirubin, UA: NEGATIVE
Glucose, UA: NEGATIVE
KETONES UA: NEGATIVE
Leukocytes, UA: NEGATIVE
Nitrite, UA: NEGATIVE
PROTEIN UA: NEGATIVE
SPEC GRAV UA: 1.025
UROBILINOGEN UA: NEGATIVE
pH, UA: 6

## 2016-01-08 MED ORDER — HYDROCODONE-ACETAMINOPHEN 7.5-325 MG PO TABS: 1.0000 | ORAL_TABLET | Freq: Four times a day (QID) | ORAL | 0 refills | Status: DC | PRN

## 2016-01-08 NOTE — Addendum Note (Signed)
Addended by: Lavell IslamHOTON, Lisbet Busker M on: 01/08/2016 04:40 PM   Modules accepted: Orders

## 2016-01-08 NOTE — Progress Notes (Signed)
   Subjective:    Patient ID: Augustine Radarimothy Brandenberger, male    DOB: 09/23/1970, 45 y.o.   MRN: [id number]  HPI He is here for consult concerning the recent onset of abdominal pain. Apparently it started originally his left flank pain radiating around to the abdomen and and also difficulty with constipation. He was seen in the emergency November 11th. Was mainly treated for constipation and then referred to Dr. Dulce Sellaroutlaw. He was seen on the eighth by Dr. Dulce Sellarutlaw. He is scheduled for CT scan of abdomen and blood work was drawn. The blood work does show an elevated creatinine of 2.15. He still complains of flank and abdominal pain  Review of Systems     Objective:   Physical Exam Alert and complaining of abdominal and flank pain. Urine dipstick was positive for red cells.       Assessment & Plan:  Left upper quadrant pain - Plan: HYDROcodone-acetaminophen (NORCO) 7.5-325 MG tablet  Constipation, unspecified constipation type - Plan: HYDROcodone-acetaminophen (NORCO) 7.5-325 MG tablet  Hematuria, unspecified type - Plan: HYDROcodone-acetaminophen (NORCO) 7.5-325 MG tablet  He is scheduled for a CT scan. Pain medication given. My concern is this is a kidney stone and since he has had for this long, it might require urologic intervention. I relayed this information to them.

## 2016-01-09 ENCOUNTER — Ambulatory Visit
Admission: RE | Admit: 2016-01-09 | Discharge: 2016-01-09 | Disposition: A | Discharge status: Home / Self Care | Payer: BC Managed Care – PPO | Source: Ambulatory Visit | Attending: Gastroenterology | Admitting: Gastroenterology

## 2016-01-09 DIAGNOSIS — R1032 Left lower quadrant pain: Secondary | ICD-10-CM

## 2016-01-09 DIAGNOSIS — K921 Melena: Secondary | ICD-10-CM

## 2016-01-09 DIAGNOSIS — R634 Abnormal weight loss: Secondary | ICD-10-CM

## 2016-01-12 ENCOUNTER — Encounter (HOSPITAL_COMMUNITY): Payer: Self-pay | Admitting: *Deleted

## 2016-01-12 ENCOUNTER — Ambulatory Visit (HOSPITAL_COMMUNITY): Payer: BC Managed Care – PPO | Admitting: Anesthesiology

## 2016-01-12 ENCOUNTER — Encounter (HOSPITAL_COMMUNITY)
Admission: AD | Disposition: A | Discharge status: Home / Self Care | Payer: Self-pay | Source: Ambulatory Visit | Attending: Urology

## 2016-01-12 ENCOUNTER — Other Ambulatory Visit: Payer: Self-pay | Admitting: Urology

## 2016-01-12 ENCOUNTER — Observation Stay (HOSPITAL_COMMUNITY)
Admission: AD | Admit: 2016-01-12 | Discharge: 2016-01-13 | Disposition: A | Discharge status: Home / Self Care | Payer: BC Managed Care – PPO | Source: Ambulatory Visit | Attending: Urology | Admitting: Urology

## 2016-01-12 DIAGNOSIS — N136 Pyonephrosis: Principal | ICD-10-CM | POA: Insufficient documentation

## 2016-01-12 DIAGNOSIS — N201 Calculus of ureter: Secondary | ICD-10-CM | POA: Diagnosis not present

## 2016-01-12 DIAGNOSIS — N289 Disorder of kidney and ureter, unspecified: Secondary | ICD-10-CM

## 2016-01-12 HISTORY — PX: CYSTOSCOPY W/ URETERAL STENT PLACEMENT: SHX1429

## 2016-01-12 LAB — CBC WITH DIFFERENTIAL/PLATELET
BASOS PCT: 0 %
Basophils Absolute: 0 10*3/uL (ref 0.0–0.1)
EOS PCT: 2 %
Eosinophils Absolute: 0.2 10*3/uL (ref 0.0–0.7)
HEMATOCRIT: 40.1 % (ref 39.0–52.0)
Hemoglobin: 14.5 g/dL (ref 13.0–17.0)
LYMPHS ABS: 1 10*3/uL (ref 0.7–4.0)
Lymphocytes Relative: 13 %
MCH: 26.5 pg (ref 26.0–34.0)
MCHC: 36.2 g/dL — ABNORMAL HIGH (ref 30.0–36.0)
MCV: 73.3 fL — AB (ref 78.0–100.0)
MONOS PCT: 8 %
Monocytes Absolute: 0.6 10*3/uL (ref 0.1–1.0)
NEUTROS PCT: 77 %
Neutro Abs: 6 10*3/uL (ref 1.7–7.7)
Platelets: 340 10*3/uL (ref 150–400)
RBC: 5.47 MIL/uL (ref 4.22–5.81)
RDW: 12.2 % (ref 11.5–15.5)
WBC: 7.8 10*3/uL (ref 4.0–10.5)

## 2016-01-12 LAB — BASIC METABOLIC PANEL
Anion gap: 8 (ref 5–15)
BUN: 18 mg/dL (ref 6–20)
CHLORIDE: 100 mmol/L — AB (ref 101–111)
CO2: 27 mmol/L (ref 22–32)
CREATININE: 2.12 mg/dL — AB (ref 0.61–1.24)
Calcium: 8.8 mg/dL — ABNORMAL LOW (ref 8.9–10.3)
GFR calc Af Amer: 41 mL/min — ABNORMAL LOW (ref >60)
GFR calc non Af Amer: 36 mL/min — ABNORMAL LOW (ref >60)
Glucose, Bld: 110 mg/dL — ABNORMAL HIGH (ref 65–99)
POTASSIUM: 4.2 mmol/L (ref 3.5–5.1)
SODIUM: 135 mmol/L (ref 135–145)

## 2016-01-12 SURGERY — CYSTOSCOPY, WITH RETROGRADE PYELOGRAM AND URETERAL STENT INSERTION
Anesthesia: General | Site: Ureter | Laterality: Left

## 2016-01-12 MED ORDER — CEFAZOLIN SODIUM-DEXTROSE 2-4 GM/100ML-% IV SOLN
2.0000 g | INTRAVENOUS | Status: AC
  Administered 2016-01-12: 2 g via INTRAVENOUS

## 2016-01-12 MED ORDER — ONDANSETRON HCL 4 MG/2ML IJ SOLN
INTRAMUSCULAR | Status: AC
  Filled 2016-01-12: qty 2

## 2016-01-12 MED ORDER — POLYETHYLENE GLYCOL 3350 17 G PO PACK: 17.0000 g | PACK | Freq: Every day | ORAL | Status: DC

## 2016-01-12 MED ORDER — FENTANYL CITRATE (PF) 100 MCG/2ML IJ SOLN
INTRAMUSCULAR | Status: DC | PRN
  Administered 2016-01-12 (×2): 50 ug via INTRAVENOUS

## 2016-01-12 MED ORDER — ONDANSETRON HCL 4 MG/2ML IJ SOLN
INTRAMUSCULAR | Status: DC | PRN
  Administered 2016-01-12: 4 mg via INTRAVENOUS

## 2016-01-12 MED ORDER — HYDROCODONE-ACETAMINOPHEN 5-325 MG PO TABS
1.0000 | ORAL_TABLET | ORAL | Status: DC | PRN
  Administered 2016-01-13 (×2): 1 via ORAL
  Filled 2016-01-12 (×2): qty 1

## 2016-01-12 MED ORDER — METOCLOPRAMIDE HCL 5 MG/ML IJ SOLN: 10.0000 mg | Freq: Once | INTRAMUSCULAR | Status: DC | PRN

## 2016-01-12 MED ORDER — LACTATED RINGERS IV SOLN: INTRAVENOUS | Status: DC

## 2016-01-12 MED ORDER — FENTANYL CITRATE (PF) 100 MCG/2ML IJ SOLN
INTRAMUSCULAR | Status: AC
  Filled 2016-01-12: qty 2

## 2016-01-12 MED ORDER — ALBUTEROL SULFATE (2.5 MG/3ML) 0.083% IN NEBU: 2.5000 mg | INHALATION_SOLUTION | Freq: Four times a day (QID) | RESPIRATORY_TRACT | Status: DC | PRN

## 2016-01-12 MED ORDER — PROVENTIL 90 MCG/ACT IN AERS: 2.0000 | INHALATION_SPRAY | Freq: Four times a day (QID) | RESPIRATORY_TRACT | Status: DC | PRN

## 2016-01-12 MED ORDER — HYOSCYAMINE SULFATE 0.125 MG SL SUBL
0.1250 mg | SUBLINGUAL_TABLET | SUBLINGUAL | Status: DC | PRN
  Filled 2016-01-12: qty 1

## 2016-01-12 MED ORDER — PROPOFOL 10 MG/ML IV BOLUS
INTRAVENOUS | Status: DC | PRN
  Administered 2016-01-12: 20 mg via INTRAVENOUS
  Administered 2016-01-12: 180 mg via INTRAVENOUS

## 2016-01-12 MED ORDER — ONDANSETRON HCL 4 MG/2ML IJ SOLN: 4.0000 mg | INTRAMUSCULAR | Status: DC | PRN

## 2016-01-12 MED ORDER — KCL IN DEXTROSE-NACL 10-5-0.45 MEQ/L-%-% IV SOLN
INTRAVENOUS | Status: DC
  Administered 2016-01-12: via INTRAVENOUS
  Filled 2016-01-12 (×2): qty 1000

## 2016-01-12 MED ORDER — DIPHENHYDRAMINE HCL 50 MG/ML IJ SOLN
12.5000 mg | Freq: Four times a day (QID) | INTRAMUSCULAR | Status: DC | PRN
Start: 2016-01-12 — End: 2016-01-13

## 2016-01-12 MED ORDER — SENNOSIDES-DOCUSATE SODIUM 8.6-50 MG PO TABS: 1.0000 | ORAL_TABLET | Freq: Every evening | ORAL | Status: DC | PRN

## 2016-01-12 MED ORDER — BISACODYL 10 MG RE SUPP: 10.0000 mg | Freq: Every day | RECTAL | Status: DC | PRN

## 2016-01-12 MED ORDER — LIDOCAINE HCL (CARDIAC) 20 MG/ML IV SOLN
INTRAVENOUS | Status: DC | PRN
  Administered 2016-01-12: 100 mg via INTRAVENOUS

## 2016-01-12 MED ORDER — CEFAZOLIN SODIUM-DEXTROSE 2-4 GM/100ML-% IV SOLN
INTRAVENOUS | Status: AC
  Filled 2016-01-12: qty 100

## 2016-01-12 MED ORDER — INFLUENZA VAC SPLIT QUAD 0.5 ML IM SUSY: 0.5000 mL | PREFILLED_SYRINGE | INTRAMUSCULAR | Status: DC

## 2016-01-12 MED ORDER — MEPERIDINE HCL 50 MG/ML IJ SOLN: 6.2500 mg | INTRAMUSCULAR | Status: DC | PRN

## 2016-01-12 MED ORDER — ACETAMINOPHEN 325 MG PO TABS: 650.0000 mg | ORAL_TABLET | ORAL | Status: DC | PRN

## 2016-01-12 MED ORDER — HYDROMORPHONE HCL 1 MG/ML IJ SOLN: 0.5000 mg | INTRAMUSCULAR | Status: DC | PRN

## 2016-01-12 MED ORDER — SUCCINYLCHOLINE CHLORIDE 20 MG/ML IJ SOLN
INTRAMUSCULAR | Status: AC
  Filled 2016-01-12: qty 1

## 2016-01-12 MED ORDER — SUCCINYLCHOLINE CHLORIDE 20 MG/ML IJ SOLN
INTRAMUSCULAR | Status: DC | PRN
  Administered 2016-01-12: 100 mg via INTRAVENOUS

## 2016-01-12 MED ORDER — PROPOFOL 10 MG/ML IV BOLUS
INTRAVENOUS | Status: AC
  Filled 2016-01-12: qty 20

## 2016-01-12 MED ORDER — DEXTROSE 5 % IV SOLN
1.0000 g | Freq: Every day | INTRAVENOUS | Status: DC
  Administered 2016-01-12: 1 g via INTRAVENOUS
  Filled 2016-01-12: qty 10

## 2016-01-12 MED ORDER — FLEET ENEMA 7-19 GM/118ML RE ENEM
1.0000 | ENEMA | Freq: Once | RECTAL | Status: DC | PRN
Start: 2016-01-12 — End: 2016-01-13

## 2016-01-12 MED ORDER — ZOLPIDEM TARTRATE 5 MG PO TABS: 5.0000 mg | ORAL_TABLET | Freq: Every evening | ORAL | Status: DC | PRN

## 2016-01-12 MED ORDER — FENTANYL CITRATE (PF) 100 MCG/2ML IJ SOLN: 25.0000 ug | INTRAMUSCULAR | Status: DC | PRN

## 2016-01-12 MED ORDER — DIPHENHYDRAMINE HCL 12.5 MG/5ML PO ELIX: 12.5000 mg | ORAL_SOLUTION | Freq: Four times a day (QID) | ORAL | Status: DC | PRN

## 2016-01-12 MED ORDER — LACTATED RINGERS IV SOLN
INTRAVENOUS | Status: DC | PRN
  Administered 2016-01-12: 20:00:00 via INTRAVENOUS

## 2016-01-12 SURGICAL SUPPLY — 11 items
BAG URO CATCHER STRL LF (MISCELLANEOUS) ×3 IMPLANT
CATH URET 5FR 28IN OPEN ENDED (CATHETERS) IMPLANT
CLOTH BEACON ORANGE TIMEOUT ST (SAFETY) ×3 IMPLANT
GLOVE SURG SS PI 8.0 STRL IVOR (GLOVE) IMPLANT
GOWN STRL REUS W/TWL XL LVL3 (GOWN DISPOSABLE) ×3 IMPLANT
GUIDEWIRE STR DUAL SENSOR (WIRE) ×3 IMPLANT
MANIFOLD NEPTUNE II (INSTRUMENTS) ×3 IMPLANT
PACK CYSTO (CUSTOM PROCEDURE TRAY) ×3 IMPLANT
STENT URET 6FRX26 CONTOUR (STENTS) ×2 IMPLANT
TUBING CONNECTING 10 (TUBING) ×2 IMPLANT
TUBING CONNECTING 10' (TUBING) ×1

## 2016-01-12 NOTE — Transfer of Care (Signed)
Immediate Anesthesia Transfer of Care Note  Patient: [name]  Procedure(s) Performed: Procedure(s): CYSTOSCOPY WITH, STENT PLACEMENT (Left)  Patient Location: PACU  Anesthesia Type:General  Level of Consciousness: awake, alert  and oriented  Airway & Oxygen Therapy: Patient Spontanous Breathing and Patient connected to face mask oxygen  Post-op Assessment: Report given to RN and Post -op Vital signs reviewed and stable  Post vital signs: Reviewed and stable  Last Vitals:  Vitals:   01/12/16 1735  BP: 128/82  Pulse: 92  Resp: 18  Temp: 36.7 C    Last Pain:  Vitals:   01/12/16 1735  TempSrc: Oral         Complications: No apparent anesthesia complications

## 2016-01-12 NOTE — H&P (Signed)
CC: I have ureteral stone.  HPI: [name] is a 45 year-old male patient who was referred by Dr. William M. Outlaw, MD who is here for ureteral stone.  The problem is on the left side. He first stated noticing pain on approximately 12/27/2015. This is his first kidney stone. He is currently having flank pain. He denies having back pain, groin pain, nausea, vomiting, fever, and chills. Pain is occuring on the left side. He has not caught a stone in his urine strainer since his symptoms began.   He has never had surgical treatment for calculi in the past.   [name] is a 45 yo male who is sent in consultation by Dr. Outlaw for a left ureteral stone 6mm in size on CT on 11/10. He had the onset of left flank pain about 2 weeks ago. The pain was intermittently severe. He had no nausea or vomiting. He had no hematuria. He had no voiding symptoms. He has had no other GU history. He is having mild pain now on pain meds. He had a Cr of 2.15 on 11/8.      ALLERGIES: Shellfish - Nausea    MEDICATIONS: Hydrocodone-Acetaminophen     GU PSH: None   NON-GU PSH: Nose Surgery (Unspecified)    GU PMH: Kidney Stone    NON-GU PMH: None   FAMILY HISTORY: Kidney Stones - Runs in Family   SOCIAL HISTORY: Marital Status: Married Current Smoking Status: Patient has never smoked.  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    REVIEW OF SYSTEMS:    GU Review Male:   Patient denies frequent urination, hard to postpone urination, burning/ pain with urination, get up at night to urinate, leakage of urine, stream starts and stops, trouble starting your stream, have to strain to urinate , erection problems, and penile pain.  Gastrointestinal (Upper):   Patient denies nausea, vomiting, and indigestion/ heartburn.  Gastrointestinal (Lower):   Patient denies diarrhea and constipation.  Constitutional:   Patient denies weight loss, fatigue, night sweats, and fever.  Skin:   Patient denies skin rash/ lesion and  itching.  Eyes:   Patient denies blurred vision and double vision.  Ears/ Nose/ Throat:   Patient denies sore throat and sinus problems.  Hematologic/Lymphatic:   Patient denies swollen glands and easy bruising.  Cardiovascular:   Patient denies leg swelling and chest pains.  Respiratory:   Patient denies cough and shortness of breath.  Endocrine:   Patient denies excessive thirst.  Musculoskeletal:   Patient denies back pain and joint pain.  Neurological:   Patient denies headaches and dizziness.  Psychologic:   Patient denies depression and anxiety.   VITAL SIGNS:      01/12/2016 03:35 PM  Weight 158 lb / 71.67 kg  Height 64 in / 162.56 cm  BP 108/72 mmHg  Pulse 95 /min  Temperature 98.1 F / 37 C  BMI 27.1 kg/m   MULTI-SYSTEM PHYSICAL EXAMINATION:    Constitutional: Well-nourished. No physical deformities. Normally developed. Good grooming.  Neck: Neck symmetrical, not swollen. Normal tracheal position.  Respiratory: No labored breathing, no use of accessory muscles. CTA  Cardiovascular: Normal temperature, . RRR without murmur  Lymphatic: No enlargement of neck, axillae, groin.  Skin: No paleness, no jaundice, no cyanosis. No lesion, no ulcer, no rash.  Neurologic / Psychiatric: Oriented to time, oriented to place, oriented to person. No depression, no anxiety, no agitation.  Gastrointestinal: Abdominal tenderness in the LUQ, obese. No mass, no rigidity.   Musculoskeletal: Normal   gait and station of head and neck.     PAST DATA REVIEWED:  Source Of History:  Patient  Records Review:   Previous Doctor Records  Urine Test Review:   Urinalysis  X-Ray Review: KUB: Reviewed Films. The stone was not visible on 11/2 and is radiolucent. C.T. Stone Protocol: Reviewed Films. Reviewed Report. 8 mm left ureteral stone. with 327 HU. Left hydro   Notes:                     Records from Dr. Dulce Sellarutlaw reviewed. Cr is 2.15.    PROCEDURES:          Urinalysis w/Scope - 81001 Dipstick  Dipstick Cont'd Micro  Color: Yellow Bilirubin: Neg WBC/hpf: 0 - 5/hpf  Appearance: Clear Ketones: Neg RBC/hpf: 10 - 20/hpf  Specific Gravity: 1.020 Blood: 3+ Bacteria: NS (Not Seen)  pH: <=5.0 Protein: Neg Cystals: NS (Not Seen)  Glucose: Neg Urobilinogen: 0.2 Casts: NS (Not Seen)    Nitrites: Neg Trichomonas: Not Present    Leukocyte Esterase: Neg Mucous: Not Present      Epithelial Cells: 0 - 5/hpf      Yeast: NS (Not Seen)      Sperm: Not Present    Notes:      ASSESSMENT:      ICD-10 Details  1 GU:   Calculus Ureter - N20.1 6x178mm left proximal stone that is radiolucent.  2   Acute Renal Failure - N17.0 Cr was 2.15 on 11/8.   PLAN:           Schedule Return Visit: ASAP - Schedule Surgery          Document Letter(s):  Created for Patient: Clinical Summary         Notes:   He has a 6x8 mm radiolucent left proximal stone with obstruction and ARI.   I am going to take him to the OR tonight for cystoscopy, Left RTG, Left ureteroscopy with laser and stenting. I have reviewed the risks of bleeding, infection, ureteral injury, need for secondary procedures, thrombotic events and anesthetic complications.

## 2016-01-12 NOTE — Brief Op Note (Signed)
01/12/2016  9:22 PM  PATIENT:  [name]  45 y.o. male  PRE-OPERATIVE DIAGNOSIS:  Left ureteral stone  POST-OPERATIVE DIAGNOSIS:  left ureteral stone with pyonephrosis.   PROCEDURE:  Procedure(s): CYSTOSCOPY WITH, STENT PLACEMENT (Left) LEFT RTG with INTERP.  SURGEON:  Surgeon(s) and Role:    * Bjorn PippinJohn Josepha Barbier, MD - Primary  PHYSICIAN ASSISTANT:   ASSISTANTS: none   ANESTHESIA:   general  EBL:  No intake/output data recorded.  BLOOD ADMINISTERED:none  DRAINS: 6 x 26 fr JJ stent left   LOCAL MEDICATIONS USED:  NONE  SPECIMEN:  Source of Specimen:  urine  DISPOSITION OF SPECIMEN:  PATHOLOGY  COUNTS:  YES  TOURNIQUET:  * No tourniquets in log *  DICTATION: .Other Dictation: Dictation Number [id number]  PLAN OF CARE: Admit for overnight observation  PATIENT DISPOSITION:  PACU - hemodynamically stable.   Delay start of Pharmacological VTE agent (>24hrs) due to surgical blood loss or risk of bleeding: not applicable

## 2016-01-12 NOTE — Anesthesia Procedure Notes (Signed)
Procedure Name: Intubation Date/Time: 01/12/2016 9:04 PM Performed by: Thornell MuleSTUBBLEFIELD, Golden Emile G Pre-anesthesia Checklist: Patient identified, Emergency Drugs available, Suction available and Patient being monitored Patient Re-evaluated:Patient Re-evaluated prior to inductionOxygen Delivery Method: Circle system utilized Preoxygenation: Pre-oxygenation with 100% oxygen Intubation Type: IV induction Ventilation: Mask ventilation without difficulty Laryngoscope Size: Miller and 3 Grade View: Grade II Tube type: Oral Tube size: 7.5 mm Number of attempts: 1 Airway Equipment and Method: Stylet and Oral airway Placement Confirmation: ETT inserted through vocal cords under direct vision,  positive ETCO2 and breath sounds checked- equal and bilateral Secured at: 20 cm Tube secured with: Tape Dental Injury: Teeth and Oropharynx as per pre-operative assessment

## 2016-01-12 NOTE — Anesthesia Preprocedure Evaluation (Addendum)
Anesthesia Evaluation  Patient identified by MRN, date of birth, ID band Patient awake    Reviewed: Allergy & Precautions, NPO status , Patient's Chart, lab work & pertinent test results  Airway Mallampati: II  TM Distance: >3 FB Neck ROM: Full    Dental no notable dental hx.    Pulmonary neg pulmonary ROS, asthma ,    Pulmonary exam normal breath sounds clear to auscultation       Cardiovascular negative cardio ROS Normal cardiovascular exam Rhythm:Regular Rate:Normal     Neuro/Psych negative neurological ROS  negative psych ROS   GI/Hepatic negative GI ROS, Neg liver ROS,   Endo/Other  negative endocrine ROS  Renal/GU negative Renal ROS  negative genitourinary   Musculoskeletal negative musculoskeletal ROS (+)   Abdominal   Peds negative pediatric ROS (+)  Hematology negative hematology ROS (+)   Anesthesia Other Findings   Reproductive/Obstetrics negative OB ROS                             Anesthesia Physical Anesthesia Plan  ASA: II  Anesthesia Plan: General   Post-op Pain Management:    Induction: Intravenous  Airway Management Planned: Oral ETT  Additional Equipment:   Intra-op Plan:   Post-operative Plan: Extubation in OR  Informed Consent: I have reviewed the patients History and Physical, chart, labs and discussed the procedure including the risks, benefits and alternatives for the proposed anesthesia with the patient or authorized representative who has indicated his/her understanding and acceptance.   Dental advisory given  Plan Discussed with: CRNA  Anesthesia Plan Comments:         Anesthesia Quick Evaluation

## 2016-01-13 ENCOUNTER — Encounter (HOSPITAL_COMMUNITY): Payer: Self-pay | Admitting: Urology

## 2016-01-13 DIAGNOSIS — N289 Disorder of kidney and ureter, unspecified: Secondary | ICD-10-CM

## 2016-01-13 DIAGNOSIS — N136 Pyonephrosis: Secondary | ICD-10-CM | POA: Diagnosis not present

## 2016-01-13 LAB — BASIC METABOLIC PANEL
Anion gap: 6 (ref 5–15)
BUN: 19 mg/dL (ref 6–20)
CHLORIDE: 102 mmol/L (ref 101–111)
CO2: 28 mmol/L (ref 22–32)
Calcium: 8.6 mg/dL — ABNORMAL LOW (ref 8.9–10.3)
Creatinine, Ser: 2.23 mg/dL — ABNORMAL HIGH (ref 0.61–1.24)
GFR calc non Af Amer: 34 mL/min — ABNORMAL LOW (ref >60)
GFR, EST AFRICAN AMERICAN: 39 mL/min — AB (ref >60)
Glucose, Bld: 119 mg/dL — ABNORMAL HIGH (ref 65–99)
POTASSIUM: 4.4 mmol/L (ref 3.5–5.1)
SODIUM: 136 mmol/L (ref 135–145)

## 2016-01-13 MED ORDER — HYDROCODONE-ACETAMINOPHEN 5-325 MG PO TABS: 1.0000 | ORAL_TABLET | ORAL | 0 refills | Status: DC | PRN

## 2016-01-13 MED ORDER — CEPHALEXIN 500 MG PO CAPS: 500.0000 mg | ORAL_CAPSULE | Freq: Three times a day (TID) | ORAL | 0 refills | Status: DC

## 2016-01-13 NOTE — Op Note (Signed)
NAMAugustine Radar:  Neuroth, Constantin                ACCOUNT NO.:  [id number]  MEDICAL RECORD NO.:  [id number]  LOCATION:  1441                         [facility]  PHYSICIAN:  Excell SeltzerJohn J. Annabell HowellsWrenn, M.D.    DATE OF BIRTH:  1970/11/18  DATE OF PROCEDURE:  01/12/2016 DATE OF DISCHARGE:                              OPERATIVE REPORT   PROCEDURES:  Cystoscopy with left retrograde pyelogram and interpretation, insertion of left double-J stent.  PREOPERATIVE DIAGNOSIS:  Left proximal stone with obstruction.  POSTOPERATIVE DIAGNOSIS:  Left proximal stone with obstruction with pyonephrosis.  SURGEON:  Excell SeltzerJohn J. Annabell HowellsWrenn, M.D.  ANESTHESIA:  General.  SPECIMEN:  Urine culture.  DRAINS:  A 6-French x 26-cm double-J stent.  COMPLICATIONS:  None.  INDICATIONS:  Mr. [name] is a 45 year old African American male who presented to the office today with 2-week history of some left flank pain that had become more severe over the late last week.  He had a creatinine checked on January 07, 2016, that was 2.15, and a CT scan done on January 09, 2016, showed a 6 x 8-mm radiolucent left proximal stone.  He had a KUB on January 01, 2016, that did not show the stone. It had about 370 Hounsfield units on scan, which was suspicious for uric acid.  I initially had planned to bring the patient to the operating room for attempted ureteroscopy and stenting this evening.  FINDINGS AND PROCEDURE:  He was taken to the operating room where general anesthetic was induced.  He was placed in lithotomy position and was fitted with PAS hose.  He was given 2 g of Ancef.  His perineum and genitalia were prepped with Betadine solution, he was draped in usual sterile fashion.  Cystoscopy was performed using a 23-French scope and 30-degree lens. Examination revealed a normal urethra.  The external sphincter was intact.  The prostatic urethra had mild bilobar hyperplasia without significant obstruction.  Examination of the bladder revealed  small blood in the base of the bladder.  No tumors or inflammation were noted, but there were some small yellow granules consistent with uric acid in the base of the bladder.  The ureteral orifices were unremarkable.  The left ureteral orifice was cannulated with 5-French open-ended catheter and contrast was instilled.  This revealed a very delicate, slightly beaded ureter up to the upper proximal ureter where there was a filling defect consistent with a stone and proximal dilation.  The stone was flushed back toward the kidney.  There was dilation of the calices as well.  After the retrograde pyelogram was complete, a guidewire was passed to the kidney and the open-ended catheter was removed.  After the wire was passed to the kidney, there was brisk efflux of purulent tight material from the upper tract.  It was felt either be old blood or possibly pus.  At this point, it was felt that ureteroscopy would not be prudent and that he would need time with the stent to dilate the delicate ureter and allow clearance of any infection.  A 6-French 26-cm double-J stent without tether was passed to the kidney under fluoroscopic guidance.  The wire was removed leaving a good coil in  the kidney and a good coil in the bladder.  The bladder was drained and during this process, urine with the purulent material from the upper tract was collected and will be sent for urine culture.  The patient's bladder was drained.  He was taken down from lithotomy position.  His anesthetic was reversed.  He was moved to the recovery room in stable condition.  There were no complications.     Excell SeltzerJohn J. Annabell HowellsWrenn, M.D.     JJW/MEDQ  D:  01/12/2016  T:  01/13/2016  Job:  [id number]

## 2016-01-13 NOTE — Progress Notes (Signed)
Discharge instructions given to wife verbalized understanding. Prescription given. All questions answered appropriately. Patient denies pain.

## 2016-01-13 NOTE — Discharge Summary (Signed)
Physician Discharge Summary  Patient ID: [name] MRN: [id number] DOB/AGE: 46/04/1970 46 y.o.  Admit date: 01/12/2016 Discharge date: 01/13/2016  Admission Diagnoses:  Ureteral stone  Discharge Diagnoses:  Principal Problem:   Ureteral stone Active Problems:   Renal insufficiency   Past Medical History:  Diagnosis Date  . Allergic asthma   . Allergic rhinitis   . Wears glasses     Surgeries: Procedure(s): CYSTOSCOPY WITH, STENT PLACEMENT on 01/12/2016   Consultants (if any):   Discharged Condition: Improved  Hospital Course: Roy Radarimothy Shimer is an 45 y.o. male who was admitted 01/12/2016 with a diagnosis of Ureteral stone and went to the operating room on 01/12/2016 and underwent the above named procedures.  He was found to have puralent material vs old blood in the collecting system and a delicate ureter so ureteroscopy was not done at this time.   His Cr was 2.12 last night and 2.23 this morning and his stones appear to be uric acid.   He is doing well with the stent and will be scheduled for ureteroscopy in 1-2 weeks.   I will repeat a BMP and get a uric acid level preop.   He will probably need a nephrology consult if the Cr doesn't normalize.    He was given perioperative antibiotics:  Anti-infectives    Start     Dose/Rate Route Frequency Ordered Stop   01/13/16 0000  cephALEXin (KEFLEX) 500 MG capsule     500 mg Oral 3 times daily 01/13/16 0718     01/12/16 2300  cefTRIAXone (ROCEPHIN) 1 g in dextrose 5 % 50 mL IVPB     1 g 100 mL/hr over 30 Minutes Intravenous Daily at bedtime 01/12/16 2244     01/12/16 1731  ceFAZolin (ANCEF) IVPB 2g/100 mL premix     2 g 200 mL/hr over 30 Minutes Intravenous 30 min pre-op 01/12/16 1731 01/12/16 2106    .  He was given sequential compression devicesfor DVT prophylaxis.  He benefited maximally from the hospital stay and there were no complications.    Recent vital signs:  Vitals:   01/12/16 2246 01/13/16 0504  BP: (!)  138/94 112/81  Pulse: 97 68  Resp: 16 16  Temp: 99.1 F (37.3 C) 98.1 F (36.7 C)    Recent laboratory studies:  Lab Results  Component Value Date   HGB 14.5 01/12/2016   HGB 17.6 (H) 05/27/2015   HGB 16.7 06/08/2011   Lab Results  Component Value Date   WBC 7.8 01/12/2016   PLT 340 01/12/2016   No results found for: INR Lab Results  Component Value Date   NA 136 01/13/2016   K 4.4 01/13/2016   CL 102 01/13/2016   CO2 28 01/13/2016   BUN 19 01/13/2016   CREATININE 2.23 (H) 01/13/2016   GLUCOSE 119 (H) 01/13/2016    Discharge Medications:     Medication List    STOP taking these medications   HYDROcodone-acetaminophen 7.5-325 MG tablet Commonly known as:  NORCO Replaced by:  HYDROcodone-acetaminophen 5-325 MG tablet   naproxen sodium 220 MG tablet Commonly known as:  ANAPROX     TAKE these medications   albuterol 90 MCG/ACT inhaler Commonly known as:  PROVENTIL Inhale 2 puffs into the lungs every 6 (six) hours as needed for wheezing.   cephALEXin 500 MG capsule Commonly known as:  KEFLEX Take 1 capsule (500 mg total) by mouth 3 (three) times daily.   HYDROcodone-acetaminophen 5-325 MG tablet Commonly known as:  NORCO/VICODIN Take 1-2 tablets by mouth every 4 (four) hours as needed for moderate pain. Replaces:  HYDROcodone-acetaminophen 7.5-325 MG tablet   multivitamin with minerals tablet Take 1 tablet by mouth daily.   polyethylene glycol packet Commonly known as:  MIRALAX / GLYCOLAX Take 17 g by mouth daily. What changed:  when to take this  reasons to take this   traMADol 50 MG tablet Commonly known as:  ULTRAM Take 50 mg by mouth every 6 (six) hours as needed for pain.       Diagnostic Studies: Ct Abdomen Pelvis Wo Contrast  Result Date: 01/09/2016 CLINICAL DATA:  Left lower quadrant pain, hematuria, and blood in stool for 1 week. 8 pound weight loss over past 3 weeks. EXAM: CT ABDOMEN AND PELVIS WITHOUT CONTRAST TECHNIQUE:  Multidetector CT imaging of the abdomen and pelvis was performed following the standard protocol without IV contrast. COMPARISON:  None. FINDINGS: Lower chest: No acute findings. Hepatobiliary: Tiny sub-cm low-attenuation lesion in the left hepatic lobe is too small to characterize but most likely represents a tiny cyst. No definite mass visualized on this unenhanced exam. Gallbladder is unremarkable. Pancreas: No mass or inflammatory process visualized on this unenhanced exam. Spleen:  Within normal limits in size. Adrenals/Urinary tract: Adrenal glands and right kidney are normal in appearance. Mild left hydronephrosis and perinephric stranding is seen due to a 6 mm calculus in the proximal left ureter. No other ureteral or bladder calculi identified. Stomach/Bowel: No evidence of obstruction, inflammatory process, or abnormal fluid collections. Normal appendix visualized. Vascular/Lymphatic: No pathologically enlarged lymph nodes identified. No evidence of abdominal aortic aneurysm. Reproductive:  No mass or other significant abnormality. Other:  None. Musculoskeletal: No suspicious bone lesions identified. Old left anterior rib fracture deformity incidentally noted. IMPRESSION: Mild left hydronephrosis and perinephric stranding due to 6 mm calculus in proximal left ureter. Electronically Signed   By: Myles RosenthalJohn  Stahl M.D.   On: 01/09/2016 17:11   Dg Abd 2 Views  Result Date: 01/01/2016 CLINICAL DATA:  ABDOMINAL PAIN,WORSE IN LEFT LOWER ABDOMEN FOR 5 DAYS,CONSTIPATION EXAM: ABDOMEN - 2 VIEW COMPARISON:  None. FINDINGS: Normal bowel gas pattern. Mild generalized increased stool noted in the colon. No free air. No evidence of renal or ureteral stones. Soft tissues are unremarkable. Skeletal structures are within normal limits.  Lung bases are clear. IMPRESSION: 1. No acute findings. 2. Mild generalized increased colonic stool. Electronically Signed   By: Amie Portlandavid  Ormond M.D.   On: 01/01/2016 10:29    Disposition:  01-Home or Self Care  Discharge Instructions    Discontinue IV    Complete by:  As directed       Follow-up Information    Anner CreteWRENN,Shali Vesey J, MD.   Specialty:  Urology Why:  The office will call to schedule the second procedure for 1-2 weeks.  Contact information: [address] [place] [address] [phone]            Signed: Anner CreteWRENN,Mertha Clyatt J 01/13/2016, 11:13 AM

## 2016-01-13 NOTE — Progress Notes (Signed)
Patient d/c home. Stable. 

## 2016-01-14 LAB — URINE CULTURE: Culture: NO GROWTH

## 2016-01-14 NOTE — Anesthesia Postprocedure Evaluation (Signed)
Anesthesia Post Note  Patient: [name]  Procedure(s) Performed: Procedure(s) (LRB): CYSTOSCOPY WITH, STENT PLACEMENT (Left)  Patient location during evaluation: PACU Anesthesia Type: General Level of consciousness: awake and alert Pain management: pain level controlled Vital Signs Assessment: post-procedure vital signs reviewed and stable Respiratory status: spontaneous breathing, nonlabored ventilation, respiratory function stable and patient connected to nasal cannula oxygen Cardiovascular status: blood pressure returned to baseline and stable Postop Assessment: no signs of nausea or vomiting Anesthetic complications: no     Last Vitals:  Vitals:   01/12/16 2246 01/13/16 0504  BP: (!) 138/94 112/81  Pulse: 97 68  Resp: 16 16  Temp: 37.3 C 36.7 C    Last Pain:  Vitals:   01/13/16 0825  TempSrc:   PainSc: 0-No pain   Pain Goal: Patients Stated Pain Goal: 3 (01/13/16 0505)               Phillips Groutarignan, Marsia Cino

## 2016-01-16 ENCOUNTER — Other Ambulatory Visit: Payer: Self-pay | Admitting: Urology

## 2016-01-21 ENCOUNTER — Encounter (HOSPITAL_BASED_OUTPATIENT_CLINIC_OR_DEPARTMENT_OTHER): Payer: Self-pay | Admitting: *Deleted

## 2016-01-21 NOTE — Progress Notes (Signed)
NPO AFTER MN.  ARRIVE AT 0830.  GETTING LAB WORK DONE Monday 01-26-2016 (BMET, URIC ACID).  OTHER CURRENT LAB RESULTS IN CHART AND EPIC (CBC).  MAY TAKE ONE TYPE OF PAIN RX AM DOS W/ SIPS OF WATER

## 2016-01-26 DIAGNOSIS — N179 Acute kidney failure, unspecified: Secondary | ICD-10-CM | POA: Diagnosis not present

## 2016-01-26 DIAGNOSIS — N201 Calculus of ureter: Secondary | ICD-10-CM | POA: Diagnosis present

## 2016-01-26 LAB — BASIC METABOLIC PANEL
ANION GAP: 6 (ref 5–15)
BUN: 13 mg/dL (ref 6–20)
CALCIUM: 9.3 mg/dL (ref 8.9–10.3)
CO2: 28 mmol/L (ref 22–32)
Chloride: 107 mmol/L (ref 101–111)
Creatinine, Ser: 1.55 mg/dL — ABNORMAL HIGH (ref 0.61–1.24)
GFR calc Af Amer: >60 mL/min (ref >60)
GFR calc non Af Amer: 52 mL/min — ABNORMAL LOW (ref >60)
GLUCOSE: 102 mg/dL — AB (ref 65–99)
Potassium: 3.7 mmol/L (ref 3.5–5.1)
Sodium: 141 mmol/L (ref 135–145)

## 2016-01-26 LAB — URIC ACID: Uric Acid, Serum: 7.7 mg/dL — ABNORMAL HIGH (ref 4.4–7.6)

## 2016-01-29 ENCOUNTER — Ambulatory Visit (HOSPITAL_BASED_OUTPATIENT_CLINIC_OR_DEPARTMENT_OTHER): Payer: BC Managed Care – PPO | Admitting: Anesthesiology

## 2016-01-29 ENCOUNTER — Encounter (HOSPITAL_BASED_OUTPATIENT_CLINIC_OR_DEPARTMENT_OTHER): Payer: Self-pay | Admitting: Anesthesiology

## 2016-01-29 ENCOUNTER — Ambulatory Visit (HOSPITAL_BASED_OUTPATIENT_CLINIC_OR_DEPARTMENT_OTHER)
Admission: RE | Admit: 2016-01-29 | Discharge: 2016-01-29 | Disposition: A | Discharge status: Home / Self Care | Payer: BC Managed Care – PPO | Source: Ambulatory Visit | Attending: Urology | Admitting: Urology

## 2016-01-29 ENCOUNTER — Encounter (HOSPITAL_BASED_OUTPATIENT_CLINIC_OR_DEPARTMENT_OTHER)
Admission: RE | Disposition: A | Discharge status: Home / Self Care | Payer: Self-pay | Source: Ambulatory Visit | Attending: Urology

## 2016-01-29 ENCOUNTER — Ambulatory Visit (HOSPITAL_COMMUNITY): Payer: BC Managed Care – PPO

## 2016-01-29 DIAGNOSIS — N201 Calculus of ureter: Secondary | ICD-10-CM | POA: Insufficient documentation

## 2016-01-29 DIAGNOSIS — N179 Acute kidney failure, unspecified: Secondary | ICD-10-CM | POA: Insufficient documentation

## 2016-01-29 HISTORY — DX: Calculus of ureter: N20.1

## 2016-01-29 HISTORY — DX: Personal history of other diseases of the nervous system and sense organs: Z86.69

## 2016-01-29 HISTORY — PX: CYSTOSCOPY WITH URETEROSCOPY: SHX5123

## 2016-01-29 HISTORY — PX: CYSTOSCOPY W/ URETERAL STENT REMOVAL: SHX1430

## 2016-01-29 HISTORY — DX: Disorder of kidney and ureter, unspecified: N28.9

## 2016-01-29 HISTORY — PX: HOLMIUM LASER APPLICATION: SHX5852

## 2016-01-29 SURGERY — CYSTOSCOPY WITH URETEROSCOPY
Anesthesia: General | Site: Renal | Laterality: Left

## 2016-01-29 MED ORDER — LACTATED RINGERS IV SOLN
INTRAVENOUS | Status: DC
  Filled 2016-01-29: qty 1000

## 2016-01-29 MED ORDER — MEPERIDINE HCL 25 MG/ML IJ SOLN
6.2500 mg | INTRAMUSCULAR | Status: DC | PRN
  Filled 2016-01-29: qty 1

## 2016-01-29 MED ORDER — KETOROLAC TROMETHAMINE 30 MG/ML IJ SOLN
INTRAMUSCULAR | Status: AC
  Filled 2016-01-29: qty 1

## 2016-01-29 MED ORDER — FENTANYL CITRATE (PF) 100 MCG/2ML IJ SOLN
INTRAMUSCULAR | Status: AC
  Filled 2016-01-29: qty 2

## 2016-01-29 MED ORDER — ACETAMINOPHEN 325 MG PO TABS
650.0000 mg | ORAL_TABLET | ORAL | Status: DC | PRN
  Filled 2016-01-29: qty 2

## 2016-01-29 MED ORDER — SODIUM CHLORIDE 0.9% FLUSH
3.0000 mL | INTRAVENOUS | Status: DC | PRN
  Filled 2016-01-29: qty 3

## 2016-01-29 MED ORDER — FENTANYL CITRATE (PF) 100 MCG/2ML IJ SOLN
25.0000 ug | INTRAMUSCULAR | Status: DC | PRN
  Filled 2016-01-29: qty 1

## 2016-01-29 MED ORDER — PROPOFOL 10 MG/ML IV BOLUS
INTRAVENOUS | Status: DC | PRN
  Administered 2016-01-29: 200 mg via INTRAVENOUS

## 2016-01-29 MED ORDER — PROPOFOL 10 MG/ML IV BOLUS
INTRAVENOUS | Status: AC
  Filled 2016-01-29: qty 40

## 2016-01-29 MED ORDER — FENTANYL CITRATE (PF) 100 MCG/2ML IJ SOLN
INTRAMUSCULAR | Status: DC | PRN
  Administered 2016-01-29 (×8): 25 ug via INTRAVENOUS

## 2016-01-29 MED ORDER — KETOROLAC TROMETHAMINE 30 MG/ML IJ SOLN
INTRAMUSCULAR | Status: DC | PRN
  Administered 2016-01-29: 30 mg via INTRAVENOUS

## 2016-01-29 MED ORDER — DEXAMETHASONE SODIUM PHOSPHATE 10 MG/ML IJ SOLN
INTRAMUSCULAR | Status: AC
  Filled 2016-01-29: qty 1

## 2016-01-29 MED ORDER — DEXAMETHASONE SODIUM PHOSPHATE 10 MG/ML IJ SOLN
INTRAMUSCULAR | Status: DC | PRN
  Administered 2016-01-29: 10 mg via INTRAVENOUS

## 2016-01-29 MED ORDER — SODIUM CHLORIDE 0.9 % IV SOLN
250.0000 mL | INTRAVENOUS | Status: DC | PRN
  Filled 2016-01-29: qty 250

## 2016-01-29 MED ORDER — BELLADONNA ALKALOIDS-OPIUM 16.2-60 MG RE SUPP
RECTAL | Status: AC
  Filled 2016-01-29: qty 1

## 2016-01-29 MED ORDER — ONDANSETRON HCL 4 MG/2ML IJ SOLN
INTRAMUSCULAR | Status: DC | PRN
  Administered 2016-01-29: 4 mg via INTRAVENOUS

## 2016-01-29 MED ORDER — SODIUM CHLORIDE 0.9% FLUSH
3.0000 mL | Freq: Two times a day (BID) | INTRAVENOUS | Status: DC
  Filled 2016-01-29: qty 3

## 2016-01-29 MED ORDER — MIDAZOLAM HCL 5 MG/5ML IJ SOLN
INTRAMUSCULAR | Status: DC | PRN
  Administered 2016-01-29: 2 mg via INTRAVENOUS

## 2016-01-29 MED ORDER — ACETAMINOPHEN 650 MG RE SUPP
650.0000 mg | RECTAL | Status: DC | PRN
  Filled 2016-01-29: qty 1

## 2016-01-29 MED ORDER — CEFAZOLIN SODIUM-DEXTROSE 2-4 GM/100ML-% IV SOLN
INTRAVENOUS | Status: AC
  Filled 2016-01-29: qty 100

## 2016-01-29 MED ORDER — MIDAZOLAM HCL 2 MG/2ML IJ SOLN
INTRAMUSCULAR | Status: AC
  Filled 2016-01-29: qty 2

## 2016-01-29 MED ORDER — ONDANSETRON HCL 4 MG/2ML IJ SOLN
INTRAMUSCULAR | Status: AC
  Filled 2016-01-29: qty 2

## 2016-01-29 MED ORDER — SODIUM CHLORIDE 0.9 % IR SOLN
Status: DC | PRN
  Administered 2016-01-29: 4000 mL

## 2016-01-29 MED ORDER — OXYCODONE HCL 5 MG PO TABS
5.0000 mg | ORAL_TABLET | ORAL | Status: DC | PRN
  Filled 2016-01-29: qty 2

## 2016-01-29 MED ORDER — LACTATED RINGERS IV SOLN
INTRAVENOUS | Status: DC
  Administered 2016-01-29: 09:00:00 via INTRAVENOUS
  Filled 2016-01-29: qty 1000

## 2016-01-29 MED ORDER — LIDOCAINE HCL (CARDIAC) 20 MG/ML IV SOLN
INTRAVENOUS | Status: DC | PRN
  Administered 2016-01-29: 100 mg via INTRAVENOUS

## 2016-01-29 MED ORDER — METOCLOPRAMIDE HCL 5 MG/ML IJ SOLN
10.0000 mg | Freq: Once | INTRAMUSCULAR | Status: DC | PRN
  Filled 2016-01-29: qty 2

## 2016-01-29 MED ORDER — LIDOCAINE 2% (20 MG/ML) 5 ML SYRINGE
INTRAMUSCULAR | Status: AC
  Filled 2016-01-29: qty 5

## 2016-01-29 MED ORDER — CEFAZOLIN SODIUM-DEXTROSE 2-4 GM/100ML-% IV SOLN
2.0000 g | INTRAVENOUS | Status: AC
  Administered 2016-01-29: 2 g via INTRAVENOUS
  Filled 2016-01-29: qty 100

## 2016-01-29 SURGICAL SUPPLY — 41 items
BAG DRAIN URO-CYSTO SKYTR STRL (DRAIN) ×4 IMPLANT
BAG DRN UROCATH (DRAIN) ×2
BASKET LASER NITINOL 1.9FR (BASKET) IMPLANT
BASKET STNLS GEMINI 4WIRE 3FR (BASKET) IMPLANT
BASKET STONE 1.7 NGAGE (UROLOGICAL SUPPLIES) ×2 IMPLANT
BASKET ZERO TIP NITINOL 2.4FR (BASKET) IMPLANT
BSKT STON RTRVL 120 1.9FR (BASKET)
BSKT STON RTRVL GEM 120X11 3FR (BASKET)
BSKT STON RTRVL ZERO TP 2.4FR (BASKET)
CATH URET 5FR 28IN CONE TIP (BALLOONS)
CATH URET 5FR 28IN OPEN ENDED (CATHETERS) IMPLANT
CATH URET 5FR 70CM CONE TIP (BALLOONS) IMPLANT
CLOTH BEACON ORANGE TIMEOUT ST (SAFETY) ×4 IMPLANT
ELECT REM PT RETURN 9FT ADLT (ELECTROSURGICAL)
ELECTRODE REM PT RTRN 9FT ADLT (ELECTROSURGICAL) IMPLANT
FIBER LASER FLEXIVA 1000 (UROLOGICAL SUPPLIES) IMPLANT
FIBER LASER FLEXIVA 365 (UROLOGICAL SUPPLIES) IMPLANT
FIBER LASER FLEXIVA 550 (UROLOGICAL SUPPLIES) IMPLANT
FIBER LASER TRAC TIP (UROLOGICAL SUPPLIES) ×2 IMPLANT
GLOVE BIOGEL PI IND STRL 7.0 (GLOVE) IMPLANT
GLOVE BIOGEL PI INDICATOR 7.0 (GLOVE) ×4
GLOVE ECLIPSE 7.0 STRL STRAW (GLOVE) ×2 IMPLANT
GLOVE SURG SS PI 8.0 STRL IVOR (GLOVE) ×4 IMPLANT
GOWN STRL REUS W/ TWL XL LVL3 (GOWN DISPOSABLE) ×2 IMPLANT
GOWN STRL REUS W/TWL XL LVL3 (GOWN DISPOSABLE) ×8
GUIDEWIRE 0.038 PTFE COATED (WIRE) IMPLANT
GUIDEWIRE ANG ZIPWIRE 038X150 (WIRE) IMPLANT
GUIDEWIRE STR DUAL SENSOR (WIRE) ×4 IMPLANT
IV NS 1000ML (IV SOLUTION) ×4
IV NS 1000ML BAXH (IV SOLUTION) IMPLANT
IV NS IRRIG 3000ML ARTHROMATIC (IV SOLUTION) ×4 IMPLANT
KIT BALLIN UROMAX 15FX10 (LABEL) IMPLANT
KIT BALLN UROMAX 15FX4 (MISCELLANEOUS) IMPLANT
KIT BALLN UROMAX 26 75X4 (MISCELLANEOUS)
KIT ROOM TURNOVER WOR (KITS) ×4 IMPLANT
MANIFOLD NEPTUNE II (INSTRUMENTS) ×2 IMPLANT
PACK CYSTO (CUSTOM PROCEDURE TRAY) ×4 IMPLANT
SET HIGH PRES BAL DIL (LABEL)
SHEATH ACCESS URETERAL 38CM (SHEATH) ×2 IMPLANT
TUBE CONNECTING 12'X1/4 (SUCTIONS)
TUBE CONNECTING 12X1/4 (SUCTIONS) IMPLANT

## 2016-01-29 NOTE — Anesthesia Procedure Notes (Signed)
Procedure Name: LMA Insertion Date/Time: 01/29/2016 10:07 AM Performed by: Jessica PriestBEESON, Ainslie Mazurek C Pre-anesthesia Checklist: Patient identified, Emergency Drugs available, Suction available and Patient being monitored Patient Re-evaluated:Patient Re-evaluated prior to inductionOxygen Delivery Method: Circle system utilized Preoxygenation: Pre-oxygenation with 100% oxygen Intubation Type: IV induction Ventilation: Mask ventilation without difficulty LMA: LMA inserted LMA Size: 4.0 Number of attempts: 1 Airway Equipment and Method: Bite block Placement Confirmation: positive ETCO2 and breath sounds checked- equal and bilateral ETT to lip (cm): seated well. Tube secured with: Tape Dental Injury: Teeth and Oropharynx as per pre-operative assessment

## 2016-01-29 NOTE — Transfer of Care (Signed)
Immediate Anesthesia Transfer of Care Note  Patient: Augustine Radarimothy Slotnick  Procedure(s) Performed: Procedure(s) (LRB): CYSTOSCOPY WITH URETEROSCOPY/ LASER LITHOTRIPSY (Left) HOLMIUM LASER APPLICATION (Left) CYSTOSCOPY WITH STENT REMOVAL (Left)  Patient Location: PACU  Anesthesia Type: General  Level of Consciousness: awake, sedated, patient cooperative and responds to stimulation  Airway & Oxygen Therapy: Patient Spontanous Breathing and Patient connected to [place] oxygen  Post-op Assessment: Report given to PACU RN, Post -op Vital signs reviewed and stable and Patient moving all extremities  Post vital signs: Reviewed and stable  Complications: No apparent anesthesia complications

## 2016-01-29 NOTE — H&P (View-Only) (Signed)
CC: I have ureteral stone.  HPI: [name] is a 45 year-old male patient who was referred by Dr. Dionne AnoWilliam M. Outlaw, MD who is here for ureteral stone.  The problem is on the left side. He first stated noticing pain on approximately 12/27/2015. This is his first kidney stone. He is currently having flank pain. He denies having back pain, groin pain, nausea, vomiting, fever, and chills. Pain is occuring on the left side. He has not caught a stone in his urine strainer since his symptoms began.   He has never had surgical treatment for calculi in the past.   [name] is a 45 yo male who is sent in consultation by Dr. Dulce Sellarutlaw for a left ureteral stone 6mm in size on CT on 11/10. He had the onset of left flank pain about 2 weeks ago. The pain was intermittently severe. He had no nausea or vomiting. He had no hematuria. He had no voiding symptoms. He has had no other GU history. He is having mild pain now on pain meds. He had a Cr of 2.15 on 11/8.      ALLERGIES: Shellfish - Nausea    MEDICATIONS: Hydrocodone-Acetaminophen     GU PSH: None   NON-GU PSH: Nose Surgery (Unspecified)    GU PMH: Kidney Stone    NON-GU PMH: None   FAMILY HISTORY: Kidney Stones - Runs in Family   SOCIAL HISTORY: Marital Status: Married Current Smoking Status: Patient has never smoked.  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    REVIEW OF SYSTEMS:    GU Review Male:   Patient denies frequent urination, hard to postpone urination, burning/ pain with urination, get up at night to urinate, leakage of urine, stream starts and stops, trouble starting your stream, have to strain to urinate , erection problems, and penile pain.  Gastrointestinal (Upper):   Patient denies nausea, vomiting, and indigestion/ heartburn.  Gastrointestinal (Lower):   Patient denies diarrhea and constipation.  Constitutional:   Patient denies weight loss, fatigue, night sweats, and fever.  Skin:   Patient denies skin rash/ lesion and  itching.  Eyes:   Patient denies blurred vision and double vision.  Ears/ Nose/ Throat:   Patient denies sore throat and sinus problems.  Hematologic/Lymphatic:   Patient denies swollen glands and easy bruising.  Cardiovascular:   Patient denies leg swelling and chest pains.  Respiratory:   Patient denies cough and shortness of breath.  Endocrine:   Patient denies excessive thirst.  Musculoskeletal:   Patient denies back pain and joint pain.  Neurological:   Patient denies headaches and dizziness.  Psychologic:   Patient denies depression and anxiety.   VITAL SIGNS:      01/12/2016 03:35 PM  Weight 158 lb / 71.67 kg  Height 64 in / 162.56 cm  BP 108/72 mmHg  Pulse 95 /min  Temperature 98.1 F / 37 C  BMI 27.1 kg/m   MULTI-SYSTEM PHYSICAL EXAMINATION:    Constitutional: Well-nourished. No physical deformities. Normally developed. Good grooming.  Neck: Neck symmetrical, not swollen. Normal tracheal position.  Respiratory: No labored breathing, no use of accessory muscles. CTA  Cardiovascular: Normal temperature, . RRR without murmur  Lymphatic: No enlargement of neck, axillae, groin.  Skin: No paleness, no jaundice, no cyanosis. No lesion, no ulcer, no rash.  Neurologic / Psychiatric: Oriented to time, oriented to place, oriented to person. No depression, no anxiety, no agitation.  Gastrointestinal: Abdominal tenderness in the LUQ, obese. No mass, no rigidity.   Musculoskeletal: Normal  gait and station of head and neck.     PAST DATA REVIEWED:  Source Of History:  Patient  Records Review:   Previous Doctor Records  Urine Test Review:   Urinalysis  X-Ray Review: KUB: Reviewed Films. The stone was not visible on 11/2 and is radiolucent. C.T. Stone Protocol: Reviewed Films. Reviewed Report. 8 mm left ureteral stone. with 327 HU. Left hydro   Notes:                     Records from Dr. Dulce Sellarutlaw reviewed. Cr is 2.15.    PROCEDURES:          Urinalysis w/Scope - 81001 Dipstick  Dipstick Cont'd Micro  Color: Yellow Bilirubin: Neg WBC/hpf: 0 - 5/hpf  Appearance: Clear Ketones: Neg RBC/hpf: 10 - 20/hpf  Specific Gravity: 1.020 Blood: 3+ Bacteria: NS (Not Seen)  pH: <=5.0 Protein: Neg Cystals: NS (Not Seen)  Glucose: Neg Urobilinogen: 0.2 Casts: NS (Not Seen)    Nitrites: Neg Trichomonas: Not Present    Leukocyte Esterase: Neg Mucous: Not Present      Epithelial Cells: 0 - 5/hpf      Yeast: NS (Not Seen)      Sperm: Not Present    Notes:      ASSESSMENT:      ICD-10 Details  1 GU:   Calculus Ureter - N20.1 6x178mm left proximal stone that is radiolucent.  2   Acute Renal Failure - N17.0 Cr was 2.15 on 11/8.   PLAN:           Schedule Return Visit: ASAP - Schedule Surgery          Document Letter(s):  Created for Patient: Clinical Summary         Notes:   He has a 6x8 mm radiolucent left proximal stone with obstruction and ARI.   I am going to take him to the OR tonight for cystoscopy, Left RTG, Left ureteroscopy with laser and stenting. I have reviewed the risks of bleeding, infection, ureteral injury, need for secondary procedures, thrombotic events and anesthetic complications.

## 2016-01-29 NOTE — Anesthesia Postprocedure Evaluation (Signed)
Anesthesia Post Note  Patient: Augustine Radarimothy Beil  Procedure(s) Performed: Procedure(s) (LRB): CYSTOSCOPY WITH URETEROSCOPY/ LASER LITHOTRIPSY (Left) HOLMIUM LASER APPLICATION (Left) CYSTOSCOPY WITH STENT REMOVAL (Left)  Patient location during evaluation: PACU Anesthesia Type: General Level of consciousness: awake and alert Pain management: pain level controlled Vital Signs Assessment: post-procedure vital signs reviewed and stable Respiratory status: spontaneous breathing, nonlabored ventilation, respiratory function stable and patient connected to nasal cannula oxygen Cardiovascular status: blood pressure returned to baseline and stable Postop Assessment: no signs of nausea or vomiting Anesthetic complications: no    Last Vitals:  Vitals:   01/29/16 1149 01/29/16 1200  BP:  (!) 136/99  Pulse: 74 75  Resp: 12 14  Temp:      Last Pain:  Vitals:   01/29/16 1106  TempSrc:   PainSc: Asleep                 Phillips Groutarignan, Erion Hermans

## 2016-01-29 NOTE — Anesthesia Preprocedure Evaluation (Signed)
Anesthesia Evaluation  Patient identified by MRN, date of birth, ID band Patient awake    Reviewed: Allergy & Precautions, NPO status , Patient's Chart, lab work & pertinent test results  Airway Mallampati: II  TM Distance: >3 FB Neck ROM: Full    Dental no notable dental hx.    Pulmonary neg pulmonary ROS, asthma ,    Pulmonary exam normal breath sounds clear to auscultation       Cardiovascular negative cardio ROS Normal cardiovascular exam Rhythm:Regular Rate:Normal     Neuro/Psych negative neurological ROS  negative psych ROS   GI/Hepatic negative GI ROS, Neg liver ROS,   Endo/Other  negative endocrine ROS  Renal/GU negative Renal ROS  negative genitourinary   Musculoskeletal negative musculoskeletal ROS (+)   Abdominal   Peds negative pediatric ROS (+)  Hematology negative hematology ROS (+)   Anesthesia Other Findings   Reproductive/Obstetrics negative OB ROS                             Anesthesia Physical Anesthesia Plan  ASA: II  Anesthesia Plan: General   Post-op Pain Management:    Induction: Intravenous  Airway Management Planned: LMA  Additional Equipment:   Intra-op Plan:   Post-operative Plan: Extubation in OR  Informed Consent: I have reviewed the patients History and Physical, chart, labs and discussed the procedure including the risks, benefits and alternatives for the proposed anesthesia with the patient or authorized representative who has indicated his/her understanding and acceptance.   Dental advisory given  Plan Discussed with: CRNA  Anesthesia Plan Comments:         Anesthesia Quick Evaluation  

## 2016-01-29 NOTE — Brief Op Note (Signed)
01/29/2016  10:53 AM  PATIENT:  Augustine Radarimothy Palecek  45 y.o. male  PRE-OPERATIVE DIAGNOSIS:  LEFT PROXIMAL STONE  POST-OPERATIVE DIAGNOSIS:  LEFT PROXIMAL STONE  PROCEDURE:  Procedure(s): CYSTOSCOPY WITH URETEROSCOPY/ LASER LITHOTRIPSY (Left) HOLMIUM LASER APPLICATION (Left) CYSTOSCOPY WITH STENT REMOVAL(Left)  SURGEON:  Surgeon(s) and Role:    * Bjorn PippinJohn Adaley Kiene, MD - Primary  PHYSICIAN ASSISTANT:   ASSISTANTS: none   ANESTHESIA:   general  EBL:  Total I/O In: 300 [I.V.:300] Out: -   BLOOD ADMINISTERED:none  DRAINS: none   LOCAL MEDICATIONS USED:  NONE  SPECIMEN:  Source of Specimen:  left ureteral stone.  DISPOSITION OF SPECIMEN:  to family to bring to office.   COUNTS:  YES  TOURNIQUET:  * No tourniquets in log *  DICTATION: .Other Dictation: Dictation Number [id number]  PLAN OF CARE: Discharge to home after PACU  PATIENT DISPOSITION:  PACU - hemodynamically stable.   Delay start of Pharmacological VTE agent (>24hrs) due to surgical blood loss or risk of bleeding: not applicable

## 2016-01-29 NOTE — Discharge Instructions (Addendum)
°  Post Anesthesia Home Care Instructions  Activity: Get plenty of rest for the remainder of the day. A responsible adult should stay with you for 24 hours following the procedure.  For the next 24 hours, DO NOT: -Drive a car -Advertising copywriterperate machinery -Drink alcoholic beverages -Take any medication unless instructed by your physician -Make any legal decisions or sign important papers.  Meals: Start with liquid foods such as gelatin or soup. Progress to regular foods as tolerated. Avoid greasy, spicy, heavy foods. If nausea and/or vomiting occur, drink only clear liquids until the nausea and/or vomiting subsides. Call your physician if vomiting continues.  Special Instructions/Symptoms: Your throat may feel dry or sore from the anesthesia or the breathing tube placed in your throat during surgery. If this causes discomfort, gargle with warm salt water. The discomfort should disappear within 24 hours.  If you had a scopolamine patch placed behind your ear for the management of post- operative nausea and/or vomiting:  1. The medication in the patch is effective for 72 hours, after which it should be removed.  Wrap patch in a tissue and discard in the trash. Wash hands thoroughly with soap and water. 2. You may remove the patch earlier than 72 hours if you experience unpleasant side effects which may include dry mouth, dizziness or visual disturbances. 3. Avoid touching the patch. Wash your hands with soap and water after contact with the patch.   CYSTOSCOPY HOME CARE INSTRUCTIONS  Activity: Rest for the remainder of the day.  Do not drive or operate equipment today.  You may resume normal activities in one to two days as instructed by your physician.   Meals: Drink plenty of liquids and eat light foods such as gelatin or soup this evening.  You may return to a normal meal plan tomorrow.  Return to Work: You may return to work in one to two days or as instructed by your physician.  Special  Instructions / Symptoms: Call your physician if any of these symptoms occur:   -persistent or heavy bleeding  -bleeding which continues after first few urination  -large blood clots that are difficult to pass  -urine stream diminishes or stops completely  -fever equal to or higher than 101 degrees Farenheit.  -cloudy urine with a strong, foul odor  -severe pain  Females should always wipe from front to back after elimination.  You may feel some burning pain when you urinate.  This should disappear with time.  Applying moist heat to the lower abdomen or a hot tub bath may help relieve the pain. \  Please bring your stone to the office for analysis.  I have sent a message to Dr. Susann GivensLalonde to ask him to manage the elevated uric acid level and to consider a referral to nephrology.    Patient Signature:  ________________________________________________________  Nurse's Signature:  ________________________________________________________

## 2016-01-29 NOTE — Interval H&P Note (Signed)
History and Physical Interval Note:  His Cr is down to 1.55.  He has passed some grit but it is unlikely he has passed the original stone.    01/29/2016 9:49 AM  [name]  has presented today for surgery, with the diagnosis of LEFT PROXIMAL STONE  The various methods of treatment have been discussed with the patient and family. After consideration of risks, benefits and other options for treatment, the patient has consented to  Procedure(s): CYSTOSCOPY WITH URETEROSCOPY/ LASER LITHOTRIPSY (Left) as a surgical intervention .  The patient's history has been reviewed, patient examined, no change in status, stable for surgery.  I have reviewed the patient's chart and labs.  Questions were answered to the patient's satisfaction.     [name] J

## 2016-01-30 ENCOUNTER — Encounter (HOSPITAL_BASED_OUTPATIENT_CLINIC_OR_DEPARTMENT_OTHER): Payer: Self-pay | Admitting: Urology

## 2016-01-30 NOTE — Op Note (Signed)
NAMAugustine Radar:  Ware, [name]                ACCOUNT NO.:  [id number]  MEDICAL RECORD NO.:  [id number]  LOCATION:                                 FACILITY:  PHYSICIAN:  Excell SeltzerJohn J. Annabell HowellsWrenn, M.D.    DATE OF BIRTH:  1970-09-01  DATE OF PROCEDURE:  01/29/2016 DATE OF DISCHARGE:                              OPERATIVE REPORT   The patient of Dr. Annabell HowellsWrenn.  PROCEDURE: 1. Cystoscopy with removal of left double-J stent. 2. Left ureteroscopic stone extraction with holmium laser tripsy.  PREOPERATIVE DIAGNOSIS:  Left proximal uric acid stone.  POSTOPERATIVE DIAGNOSIS:  Left proximal uric acid stone.  SURGEON:  Excell SeltzerJohn J. Annabell HowellsWrenn, M.D.  ANESTHESIA:  General.  SPECIMEN:  Stone fragments.  DRAINS:  None.  BLOOD LOSS:  None.  COMPLICATIONS:  None.  INDICATIONS:  [name] is a 45 year old African American male, who was originally seen in early November with an obstructing 6 mm left proximal radiolucent stone.  He underwent an initial attempt at ureteroscopy, but his ureter was quite delicate on retrograde and after the stone was bypassed, purulent material efflux from the kidney.  His culture proved to be negative, but it was felt that a period of stenting was indicated prior to second attempt of ureteroscopy.  FINDINGS AND PROCEDURE:  He was taken to the operating room where he was given Ancef.  A general anesthetic was induced.  He was prepped with Hibiclens because of shellfish allergy.  He was draped in usual sterile fashion.  He had been fitted with PAS hose after positioning.  Cystoscopy was performed using a 23-French scope and 30-degree lens. Examination revealed normal urethra.  The external sphincter was intact. The prostatic urethra was short without significant obstruction. Examination of bladder revealed the stent at the left ureteral orifice. There was some early incrustation of the stent erythema associated with stent irritation, and no other abnormalities were noted.  The right ureteral  orifice was unremarkable.  The stent was then grasped with a grasping forceps and pulled the urethral meatus.  A guidewire was placed through the stent under fluoroscopic guidance and the stent was removed.  A 38 cm digital access sheath inner core was then passed to the kidney without difficulty.  This was followed by the assembled core.  The wire and inner core were removed.  The dual-lumen digital ureteroscope, flexible ureteroscope was passed through the sheath to the kidney.  The stone was eventually identified in the lower pole of calyx.  The stone was then engaged with an NGage basket on an initial attempt at intact removal was unsuccessful due to the size of the stone was repositioned to the upper pole for laser therapy.  The stone was engaged with the 200 micron holmium laser fiber set initially on 0.5 watts and 20 hertz.  The power was eventually increased to 1 watt with subsequent fragmentation of the stone.  The large fragments were then removed using the NGage basket and on final inspection, there were no fragments larger than a millimeter. There was some dust from the procedure.  An attempt was made to flush some of this out with higher pressure irrigation.  The grasper was removed from  the ureteroscope.  Once the area had been irrigated, final inspection revealed no other stone fragments and other areas of the kidney.  The ureteroscope was removed under direct vision along with the sheath.  No significant ureteral injury was identified, so it was not felt that a replacement stent was indicated.  At this point, the ureteroscope and sheath were removed.  The bladder was drained using the cystoscope sheath.  The patient was taken down from lithotomy position.  His anesthetic was reversed.  He was moved to recovery room in stable condition.  There were no complications.     Excell SeltzerJohn J. Annabell HowellsWrenn, M.D.   ______________________________ Excell SeltzerJohn J. Annabell HowellsWrenn,  M.D.    JJW/MEDQ  D:  01/29/2016  T:  01/30/2016  Job:  [id number]

## 2016-02-03 ENCOUNTER — Encounter: Payer: Self-pay | Admitting: Family Medicine

## 2016-02-03 ENCOUNTER — Ambulatory Visit (INDEPENDENT_AMBULATORY_CARE_PROVIDER_SITE_OTHER): Payer: BC Managed Care – PPO | Admitting: Family Medicine

## 2016-02-03 VITALS — BP 120/76 | HR 85 | Ht 64.0 in | Wt 155.0 lb

## 2016-02-03 DIAGNOSIS — E790 Hyperuricemia without signs of inflammatory arthritis and tophaceous disease: Secondary | ICD-10-CM | POA: Diagnosis not present

## 2016-02-03 DIAGNOSIS — N200 Calculus of kidney: Secondary | ICD-10-CM

## 2016-02-03 DIAGNOSIS — N289 Disorder of kidney and ureter, unspecified: Secondary | ICD-10-CM | POA: Diagnosis not present

## 2016-02-03 NOTE — Progress Notes (Signed)
   Subjective:    Patient ID: [name], male    DOB: 06/17/1970, 45 y.o.   MRN: [id number]  HPI he is here for a follow-up visit after recent urologic procedure for kidney stone. Blood work to urology did show elevated uric acid level as well as increased creatinine. The procedure was done on November 30. Presently he states he is having a 1/10 pain. He is staying well-hydrated. He did collect stones and will be taking them in for evaluation later this week.  Review of Systems     Objective:   Physical Exam Alert and in no distress otherwise not examined       Assessment & Plan:  Renal stone - Plan: Comprehensive metabolic panel, Uric Acid  Renal insufficiency - Plan: Comprehensive metabolic panel  Hyperuricemia - Plan: Uric Acid I discussed the elevated uric acid level as well as possible renal insufficiency. Also discussed keeping himself well-hydrated. I will call him to get the results back for further evaluation.

## 2016-02-04 LAB — COMPREHENSIVE METABOLIC PANEL
ALK PHOS: 71 U/L (ref 40–115)
ALT: 26 U/L (ref 9–46)
AST: 18 U/L (ref 10–40)
Albumin: 4.1 g/dL (ref 3.6–5.1)
BUN: 14 mg/dL (ref 7–25)
CO2: 25 mmol/L (ref 20–31)
CREATININE: 1.34 mg/dL (ref 0.60–1.35)
Calcium: 9.3 mg/dL (ref 8.6–10.3)
Chloride: 105 mmol/L (ref 98–110)
GLUCOSE: 84 mg/dL (ref 65–99)
POTASSIUM: 4.2 mmol/L (ref 3.5–5.3)
SODIUM: 141 mmol/L (ref 135–146)
TOTAL PROTEIN: 7.1 g/dL (ref 6.1–8.1)
Total Bilirubin: 0.6 mg/dL (ref 0.2–1.2)

## 2016-02-04 LAB — URIC ACID: URIC ACID, SERUM: 7.9 mg/dL (ref 4.0–8.0)

## 2016-02-19 ENCOUNTER — Encounter: Payer: Self-pay | Admitting: Family Medicine

## 2017-07-07 ENCOUNTER — Encounter: Payer: Self-pay | Admitting: Medical

## 2017-07-07 ENCOUNTER — Ambulatory Visit: Payer: BC Managed Care – PPO | Admitting: Medical

## 2017-07-07 ENCOUNTER — Encounter: Payer: Self-pay | Admitting: Family Medicine

## 2017-07-07 VITALS — BP 116/78 | HR 87 | Temp 97.8°F | Ht 63.5 in | Wt 157.2 lb

## 2017-07-07 DIAGNOSIS — J301 Allergic rhinitis due to pollen: Secondary | ICD-10-CM | POA: Diagnosis not present

## 2017-07-07 DIAGNOSIS — Z7189 Other specified counseling: Secondary | ICD-10-CM

## 2017-07-07 DIAGNOSIS — N289 Disorder of kidney and ureter, unspecified: Secondary | ICD-10-CM | POA: Diagnosis not present

## 2017-07-07 DIAGNOSIS — Z0000 Encounter for general adult medical examination without abnormal findings: Secondary | ICD-10-CM

## 2017-07-07 DIAGNOSIS — Z125 Encounter for screening for malignant neoplasm of prostate: Secondary | ICD-10-CM

## 2017-07-07 DIAGNOSIS — Z7185 Encounter for immunization safety counseling: Secondary | ICD-10-CM

## 2017-07-07 LAB — POCT URINALYSIS DIP (PROADVANTAGE DEVICE)
BILIRUBIN UA: NEGATIVE mg/dL
Bilirubin, UA: NEGATIVE
Glucose, UA: NEGATIVE mg/dL
Leukocytes, UA: NEGATIVE
Nitrite, UA: NEGATIVE
PH UA: 6 (ref 5.0–8.0)
PROTEIN UA: NEGATIVE mg/dL

## 2017-07-07 NOTE — Addendum Note (Signed)
Addended by: Victorio Palm on: 07/07/2017 02:19 PM   Modules accepted: Orders

## 2017-07-07 NOTE — Patient Instructions (Signed)
Thanks for trusting [place] with your health care and for coming in for a physical today.  Below are some general recommendations I have for you:  Yearly screenings See your eye doctor yearly for routine vision care. See your dentist yearly for routine dental care including hygiene visits twice yearly. See me here yearly for a routine physical and preventative care visit   Specific Concerns today:  . Call insurance about doing colon cancer screen now vs age 37 . We will call with lab results   Please follow up yearly for a physical.   Preventative Care for Adults - Male      MAINTAIN REGULAR HEALTH EXAMS:  A routine yearly physical is a good way to check in with your primary care provider about your health and preventive screening. It is also an opportunity to share updates about your health and any concerns you have, and receive a thorough all-over exam.   Most health insurance companies pay for at least some preventative services.  Check with your health plan for specific coverages.  WHAT PREVENTATIVE SERVICES DO WOMEN NEED?  Adult men should have their weight and blood pressure checked regularly.   Men age 48 and older should have their cholesterol levels checked regularly.  Beginning at age 72 and continuing to age 84, men should be screened for colorectal cancer.  Certain people may need continued testing until age 67.  Updating vaccinations is part of preventative care.  Vaccinations help protect against diseases such as the flu.  Osteoporosis is a disease in which the bones lose minerals and strength as we age. Men ages 56 and over should discuss this with their caregivers  Lab tests are generally done as part of preventative care to screen for anemia and blood disorders, to screen for problems with the kidneys and liver, to screen for bladder problems, to check blood sugar, and to check your cholesterol level.  Preventative services generally include counseling about diet,  exercise, avoiding tobacco, drugs, excessive alcohol consumption, and sexually transmitted infections.    GENERAL RECOMMENDATIONS FOR GOOD HEALTH:  Healthy diet:  Eat a variety of foods, including fruit, vegetables, animal or vegetable protein, such as meat, fish, chicken, and eggs, or beans, lentils, tofu, and grains, such as rice.  Drink plenty of water daily.  Decrease saturated fat in the diet, avoid lots of red meat, processed foods, sweets, fast foods, and fried foods.  Exercise:  Aerobic exercise helps maintain good heart health. At least 30-40 minutes of moderate-intensity exercise is recommended. For example, a brisk walk that increases your heart rate and breathing. This should be done on most days of the week.   Find a type of exercise or a variety of exercises that you enjoy so that it becomes a part of your daily life.  Examples are running, walking, swimming, water aerobics, and biking.  For motivation and support, explore group exercise such as aerobic class, spin class, Zumba, Yoga,or  martial arts, etc.    Set exercise goals for yourself, such as a certain weight goal, walk or run in a race such as a 5k walk/run.  Speak to your primary care provider about exercise goals.  Disease prevention:  If you smoke or chew tobacco, find out from your caregiver how to quit. It can literally save your life, no matter how long you have been a tobacco user. If you do not use tobacco, never begin.   Maintain a healthy diet and normal weight. Increased weight leads to  problems with blood pressure and diabetes.   The Body Mass Index or BMI is a way of measuring how much of your body is fat. Having a BMI above 27 increases the risk of heart disease, diabetes, hypertension, stroke and other problems related to obesity. Your caregiver can help determine your BMI and based on it develop an exercise and dietary program to help you achieve or maintain this important measurement at a healthful  level.  High blood pressure causes heart and blood vessel problems.  Persistent high blood pressure should be treated with medicine if weight loss and exercise do not work.   Fat and cholesterol leaves deposits in your arteries that can block them. This causes heart disease and vessel disease elsewhere in your body.  If your cholesterol is found to be high, or if you have heart disease or certain other medical conditions, then you may need to have your cholesterol monitored frequently and be treated with medication.   Ask if you should have a cardiac stress test if your history suggests this. A stress test is a test done on a treadmill that looks for heart disease. This test can find disease prior to there being a problem.  Osteoporosis is a disease in which the bones lose minerals and strength as we age. This can result in serious bone fractures. Risk of osteoporosis can be identified using a bone density scan. Men ages 58 and over should discuss this with their caregivers. Ask your caregiver whether you should be taking a calcium supplement and Vitamin D, to reduce the rate of osteoporosis.   Avoid drinking alcohol in excess (more than two drinks per day).  Avoid use of street drugs. Do not share needles with anyone. Ask for professional help if you need assistance or instructions on stopping the use of alcohol, cigarettes, and/or drugs.  Brush your teeth twice a day with fluoride toothpaste, and floss once a day. Good oral hygiene prevents tooth decay and gum disease. The problems can be painful, unattractive, and can cause other health problems. Visit your dentist for a routine oral and dental check up and preventive care every 6-12 months.   Look at your skin regularly.  Use a mirror to look at your back. Notify your caregivers of changes in moles, especially if there are changes in shapes, colors, a size larger than a pencil eraser, an irregular border, or development of new  moles.  Safety:  Use seatbelts 100% of the time, whether driving or as a passenger.  Use safety devices such as hearing protection if you work in environments with loud noise or significant background noise.  Use safety glasses when doing any work that could send debris in to the eyes.  Use a helmet if you ride a bike or motorcycle.  Use appropriate safety gear for contact sports.  Talk to your caregiver about gun safety.  Use sunscreen with a SPF (or skin protection factor) of 15 or greater.  Lighter skinned people are at a greater risk of skin cancer. Don't forget to also wear sunglasses in order to protect your eyes from too much damaging sunlight. Damaging sunlight can accelerate cataract formation.   Practice safe sex. Use condoms. Condoms are used for birth control and to help reduce the spread of sexually transmitted infections (or STIs).  Some of the STIs are gonorrhea (the clap), chlamydia, syphilis, trichomonas, herpes, HPV (human papilloma virus) and HIV (human immunodeficiency virus) which causes AIDS. The herpes, HIV and HPV are viral  illnesses that have no cure. These can result in disability, cancer and death.   Keep carbon monoxide and smoke detectors in your home functioning at all times. Change the batteries every 6 months or use a model that plugs into the wall.   Vaccinations:  Stay up to date with your tetanus shots and other required immunizations. You should have a booster for tetanus every 10 years. Be sure to get your flu shot every year, since 5%-20% of the [place] population comes down with the flu. The flu vaccine changes each year, so being vaccinated once is not enough. Get your shot in the fall, before the flu season peaks.   Other vaccines to consider:  Human Papilloma Virus or HPV causes cancer of the cervix, and other infections that can be transmitted from person to person. There is a vaccine for HPV, and males should get immunized between the ages of 92 and 14. It  requires a series of 3 shots.   Pneumococcal vaccine to protect against certain types of pneumonia.  This is normally recommended for adults age 6 or older.  However, adults younger than 48 years old with certain underlying conditions such as diabetes, heart or lung disease should also receive the vaccine.  Shingles vaccine to protect against Varicella Zoster if you are older than age 84, or younger than 48 years old with certain underlying illness.  If you have not had the Shingrix vaccine, please call your insurer to inquire about coverage for the Shingrix vaccine given in 2 doses.   Some insurers cover this vaccine after age 41, some cover this after age 39.  If your insurer covers this, then call to schedule appointment to have this vaccine here  Hepatitis A vaccine to protect against a form of infection of the liver by a virus acquired from food.  Hepatitis B vaccine to protect against a form of infection of the liver by a virus acquired from blood or body fluids, particularly if you work in health care.  If you plan to travel internationally, check with your local health department for specific vaccination recommendations.   What should I know about cancer screening? Many types of cancers can be detected early and may often be prevented. Lung Cancer  You should be screened every year for lung cancer if: ? You are a current smoker who has smoked for at least 30 years. ? You are a former smoker who has quit within the past 15 years.  Talk to your health care provider about your screening options, when you should start screening, and how often you should be screened.  Colorectal Cancer  Routine colorectal cancer screening usually begins at 48 years of age and should be repeated every 5-10 years until you are 48 years old. You may need to be screened more often if early forms of precancerous polyps or small growths are found. Your health care provider may recommend screening at an earlier  age if you have risk factors for colon cancer.  Your health care provider may recommend using home test kits to check for hidden blood in the stool.  A small camera at the end of a tube can be used to examine your colon (sigmoidoscopy or colonoscopy). This checks for the earliest forms of colorectal cancer.  Prostate and Testicular Cancer  Depending on your age and overall health, your health care provider may do certain tests to screen for prostate and testicular cancer.  Talk to your health care provider about any  symptoms or concerns you have about testicular or prostate cancer.  Skin Cancer  Check your skin from head to toe regularly.  Tell your health care provider about any new moles or changes in moles, especially if: ? There is a change in a mole's size, shape, or color. ? You have a mole that is larger than a pencil eraser.  Always use sunscreen. Apply sunscreen liberally and repeat throughout the day.  Protect yourself by wearing long sleeves, pants, a wide-brimmed hat, and sunglasses when outside.

## 2017-07-07 NOTE — Progress Notes (Signed)
Subjective:   HPI  [name] is a 47 y.o. male who presents for Chief Complaint  Patient presents with  . Annual Exam    Medical care team includes: [name], [name] here for primary care Dentist Eye doctor  Concerns: Mole on left abdomen  Reviewed their medical, surgical, family, social, medication, and allergy history and updated chart as appropriate.  Past Medical History:  Diagnosis Date  . Allergic rhinitis   . Asthma    rarely has problems with this as of 06/2017  . 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 Left ureteral stone 2017   managed by urology  . Renal insufficiency   . Wears glasses     Past Surgical History:  Procedure Laterality Date  . CYSTOSCOPY W/ URETERAL STENT PLACEMENT Left 01/12/2016   Procedure: CYSTOSCOPY WITH, STENT PLACEMENT;  Surgeon: Bjorn Pippin, MD;  Location: [facility];  Service: Urology;  Laterality: Left;  . CYSTOSCOPY W/ URETERAL STENT REMOVAL Left 01/29/2016   Procedure: CYSTOSCOPY WITH STENT REMOVAL;  Surgeon: Bjorn Pippin, MD;  Location: [facility];  Service: Urology;  Laterality: Left;  . CYSTOSCOPY WITH URETEROSCOPY Left 01/29/2016   Procedure: CYSTOSCOPY WITH URETEROSCOPY/ LASER LITHOTRIPSY;  Surgeon: Bjorn Pippin, MD;  Location: [facility];  Service: Urology;  Laterality: Left;  . HOLMIUM LASER APPLICATION Left 01/29/2016   Procedure: HOLMIUM LASER APPLICATION;  Surgeon: Bjorn Pippin, MD;  Location: [facility];  Service: Urology;  Laterality: Left;  . NASAL FRACTURE SURGERY  1996    Social History   Socioeconomic History  . Marital status: Married    Spouse name: Not on file  . Number of children: Not on file  . Years of education: Not on file  . Highest education level: Not on file  Occupational History  . Occupation: social studies teacher grade 7    Employer: [employer]  Social Needs  . Financial  resource strain: Not on file  . Food insecurity:    Worry: Not on file    Inability: Not on file  . Transportation needs:    Medical: Not on file    Non-medical: Not on file  Tobacco Use  . Smoking status: Never Smoker  . Smokeless tobacco: Never Used  Substance and Sexual Activity  . Alcohol use: Yes    Comment: occasional wine  . Drug use: No  . Sexual activity: Yes  Lifestyle  . Physical activity:    Days per week: Not on file    Minutes per session: Not on file  . Stress: Not on file  Relationships  . Social connections:    Talks on phone: Not on file    Gets together: Not on file    Attends religious service: Not on file    Active member of club or organization: Not on file    Attends meetings of clubs or organizations: Not on file    Relationship status: Not on file  . Intimate partner violence:    Fear of current or ex partner: Not on file    Emotionally abused: Not on file    Physically abused: Not on file    Forced sexual activity: Not on file  Other Topics Concern  . Not on file  Social History Narrative   Married, wife is Verlon Au, has 2 biological, and currently 8 foster children (teenagers).  Exercise some with running, walking, some  weights, golf, and stretching.  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  06/2017    Family History  Problem Relation Age of Onset  . Diabetes Mother   . Cancer Mother        breast  . Diabetes Father   . Hypertension Father   . Other Father        chemical exposure in [place]  . Stroke Neg Hx   . Heart disease Neg Hx   . Hyperlipidemia Neg Hx      Current Outpatient Medications:  Marland Kitchen  Multiple Vitamins-Minerals (MULTIVITAMIN WITH MINERALS) tablet, Take 1 tablet by mouth daily., Disp: , Rfl:   Allergies  Allergen Reactions  . Shellfish Allergy Hives, Itching and Nausea And Vomiting       Review of Systems Constitutional: -fever, -chills, -sweats, -unexpected weight change, -decreased appetite,  -fatigue Allergy: -sneezing, -itching, -congestion Dermatology: -changing moles, --rash, -lumps ENT: -runny nose, -ear pain, -sore throat, -hoarseness, -sinus pain, -teeth pain, - ringing in ears, -hearing loss, -nosebleeds Cardiology: -chest pain, -palpitations, -swelling, -difficulty breathing when lying flat, -waking up short of breath Respiratory: -cough, -shortness of breath, -difficulty breathing with exercise or exertion, -wheezing, -coughing up blood Gastroenterology: -abdominal pain, -nausea, -vomiting, -diarrhea, -constipation, -blood in stool, -changes in bowel movement, -difficulty swallowing or eating Hematology: -bleeding, -bruising  Musculoskeletal: -joint aches, -muscle aches, -joint swelling, -back pain, -neck pain, -cramping, -changes in gait Ophthalmology: denies vision changes, eye redness, itching, discharge Urology: -burning with urination, -difficulty urinating, -blood in urine, -urinary frequency, -urgency, -incontinence Neurology: -headache, -weakness, -tingling, -numbness, -memory loss, -falls, -dizziness Psychology: -depressed mood, -agitation, -sleep problems Male GU: no testicular mass, pain, no lymph nodes swollen, no swelling, no rash.     Objective:  BP 116/78   Pulse 87   Temp 97.8 F (36.6 C) (Oral)   Ht 5' 3.5" (1.613 m)   Wt 157 lb 3.2 oz (71.3 kg)   SpO2 96%   BMI 27.41 kg/m   General appearance: alert, no distress, WD/WN, African American male Skin: left lateral abdomen with 6mm x 3mm raised oval lesion, brown, nonspecific, no other worrisome lesions HEENT: normocephalic, conjunctiva/corneas normal, sclerae anicteric, PERRLA, EOMi, nares patent, no discharge or erythema, pharynx normal Oral cavity: MMM, tongue normal, teeth in good repair Neck: supple, no lymphadenopathy, no thyromegaly, no masses, normal ROM, no bruits Chest: non tender, normal shape and expansion Heart: RRR, normal S1, S2, no murmurs Lungs: CTA bilaterally, no wheezes,  rhonchi, or rales Abdomen: +bs, soft, non tender, non distended, no masses, no hepatomegaly, no splenomegaly, no bruits Back: non tender, normal ROM, no scoliosis Musculoskeletal: upper extremities non tender, no obvious deformity, normal ROM throughout, lower extremities non tender, no obvious deformity, normal ROM throughout Extremities: no edema, no cyanosis, no clubbing Pulses: 2+ symmetric, upper and lower extremities, normal cap refill Neurological: alert, oriented x 3, CN2-12 intact, strength normal upper extremities and lower extremities, sensation normal throughout, DTRs 2+ throughout, no cerebellar signs, gait normal Psychiatric: normal affect, behavior normal, pleasant  GU: normal male external genitalia,circumcised, nontender, no masses, no hernia, no lymphadenopathy Rectal: declined   Assessment and Plan :   Encounter Diagnoses  Name Primary?  . Encounter for health maintenance examination in adult Yes  . Screening for prostate cancer   . Renal insufficiency   . Allergic rhinitis due to pollen, unspecified seasonality   . Vaccine counseling     Physical exam - discussed and counseled on healthy lifestyle, diet, exercise, preventative care, vaccinations, sick and  well care, proper use of emergency dept and after hours care, and addressed their concerns.    Health screening: See your eye doctor yearly for routine vision care. See your dentist yearly for routine dental care including hygiene visits twice yearly.  Cancer screening Colonoscopy:  Call insurance to see if they cover screening age 70 and up  Discussed PSA, prostate exam, and prostate cancer screening risks/benefits.     Vaccinations: Advised yearly influenza vaccine UTD on tetanus booster   Acute issues discussed: none  Separate significant chronic issues discussed: Asthma - rare issue with this. Mild seasonal allergies  Skin lesions - unchanged per patient but if he sees any changes, return for  excisional biopsy.  Tareq was seen today for annual exam.  Diagnoses and all orders for this visit:  Encounter for health maintenance examination in adult -     Comprehensive metabolic panel -     CBC -     PSA -     Lipid panel  Screening for prostate cancer -     PSA  Renal insufficiency  Allergic rhinitis due to pollen, unspecified seasonality  Vaccine counseling   Follow-up pending labs, yearly for physical

## 2017-07-07 NOTE — Addendum Note (Signed)
Addended by: Victorio Palm on: 07/07/2017 09:20 AM   Modules accepted: Orders

## 2017-07-08 ENCOUNTER — Other Ambulatory Visit: Payer: Self-pay | Admitting: Medical

## 2017-07-08 DIAGNOSIS — D751 Secondary polycythemia: Secondary | ICD-10-CM

## 2017-07-08 LAB — COMPREHENSIVE METABOLIC PANEL
A/G RATIO: 1.4 (ref 1.2–2.2)
ALBUMIN: 4.4 g/dL (ref 3.5–5.5)
ALT: 24 IU/L (ref 0–44)
AST: 17 IU/L (ref 0–40)
Alkaline Phosphatase: 90 IU/L (ref 39–117)
BUN/Creatinine Ratio: 7 — ABNORMAL LOW (ref 9–20)
BUN: 9 mg/dL (ref 6–24)
Bilirubin Total: 0.6 mg/dL (ref 0.0–1.2)
CO2: 24 mmol/L (ref 20–29)
Calcium: 9.6 mg/dL (ref 8.7–10.2)
Chloride: 103 mmol/L (ref 96–106)
Creatinine, Ser: 1.25 mg/dL (ref 0.76–1.27)
GFR calc non Af Amer: 68 mL/min/{1.73_m2} (ref >59)
GFR, EST AFRICAN AMERICAN: 78 mL/min/{1.73_m2} (ref >59)
Globulin, Total: 3.1 g/dL (ref 1.5–4.5)
Glucose: 100 mg/dL — ABNORMAL HIGH (ref 65–99)
POTASSIUM: 4 mmol/L (ref 3.5–5.2)
Sodium: 140 mmol/L (ref 134–144)
TOTAL PROTEIN: 7.5 g/dL (ref 6.0–8.5)

## 2017-07-08 LAB — CBC
HEMATOCRIT: 49.2 % (ref 37.5–51.0)
HEMOGLOBIN: 17.4 g/dL (ref 13.0–17.7)
MCH: 26.7 pg (ref 26.6–33.0)
MCHC: 35.4 g/dL (ref 31.5–35.7)
MCV: 76 fL — ABNORMAL LOW (ref 79–97)
Platelets: 264 10*3/uL (ref 150–379)
RBC: 6.51 x10E6/uL — ABNORMAL HIGH (ref 4.14–5.80)
RDW: 14.3 % (ref 12.3–15.4)
WBC: 5.2 10*3/uL (ref 3.4–10.8)

## 2017-07-08 LAB — URINALYSIS, MICROSCOPIC ONLY
Bacteria, UA: NONE SEEN
Casts: NONE SEEN /lpf
EPITHELIAL CELLS (NON RENAL): NONE SEEN /HPF (ref 0–10)
RBC MICROSCOPIC, UA: NONE SEEN /HPF (ref 0–2)
WBC, UA: NONE SEEN /hpf (ref 0–5)

## 2017-07-08 LAB — PSA: PROSTATE SPECIFIC AG, SERUM: 0.9 ng/mL (ref 0.0–4.0)

## 2017-07-08 LAB — LIPID PANEL
Chol/HDL Ratio: 4.7 ratio (ref 0.0–5.0)
Cholesterol, Total: 214 mg/dL — ABNORMAL HIGH (ref 100–199)
HDL: 46 mg/dL (ref >39)
LDL CALC: 145 mg/dL — AB (ref 0–99)
Triglycerides: 113 mg/dL (ref 0–149)
VLDL CHOLESTEROL CAL: 23 mg/dL (ref 5–40)

## 2018-01-22 IMAGING — CT CT ABD-PELV W/O CM
2 of 4 series · 10 of 36 positions shown, 17 images · IV contrast (READICAT/WATER)
Comparison: None.

CLINICAL DATA: Left lower quadrant pain, hematuria, and blood in
stool for 1 week. 8 pound weight loss over past 3 weeks.

EXAM:
CT ABDOMEN AND PELVIS WITHOUT CONTRAST
TECHNIQUE: Multidetector CT imaging of the abdomen and pelvis was performed
following the standard protocol without IV contrast.

[Series 3: abd/pelvis w/o · axial · non-contrast · 0.70mm/px · z∈[-336,-6]mm · 9 of 82 slices shown, 15 images]
[im 9/82  soft-tissue]
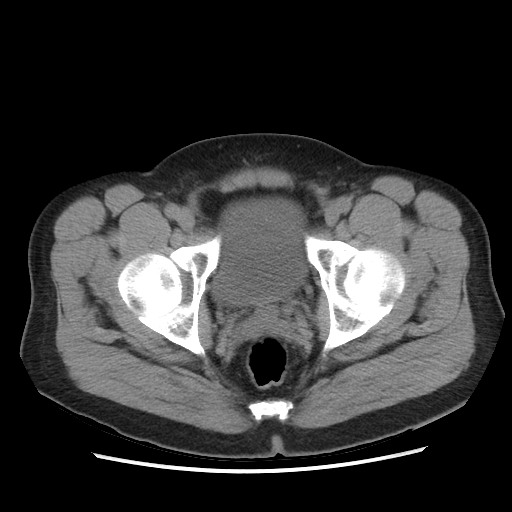
[im 9/82  bone]
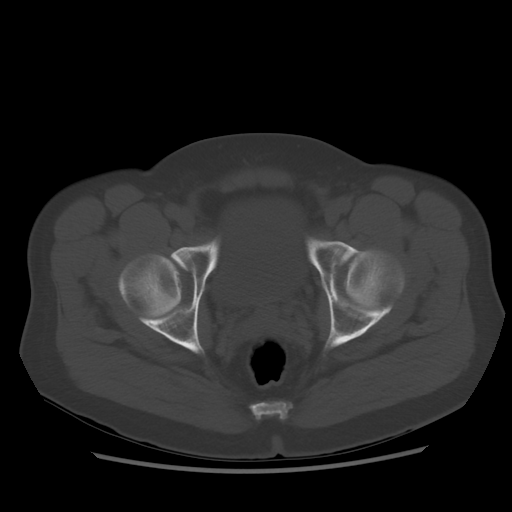
[im 17/82  soft-tissue]
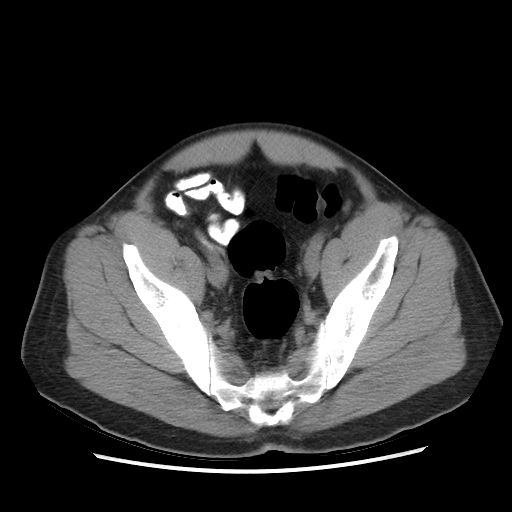
[im 25/82  soft-tissue]
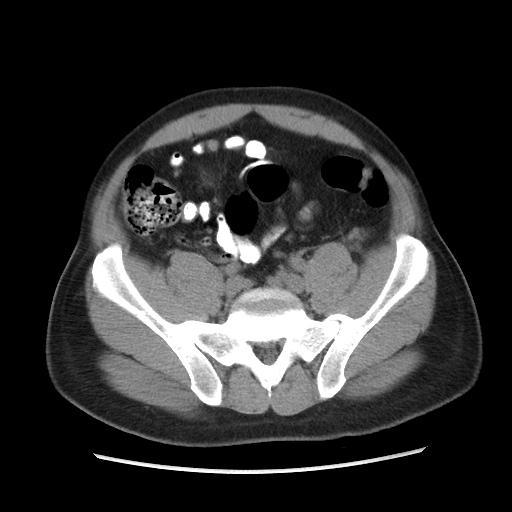
[im 33/82  soft-tissue]
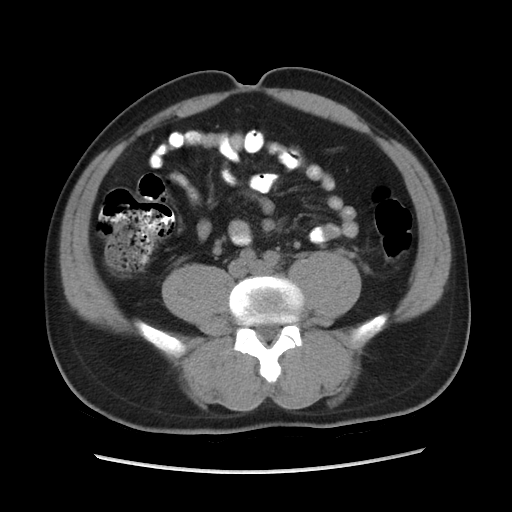
[im 41/82  soft-tissue]
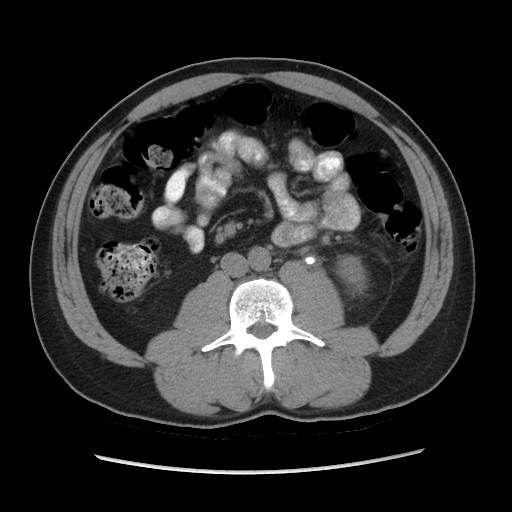
[im 49/82  soft-tissue]
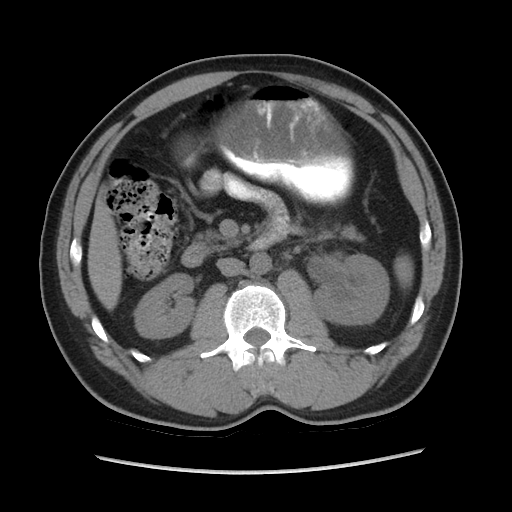
[im 49/82  lung]
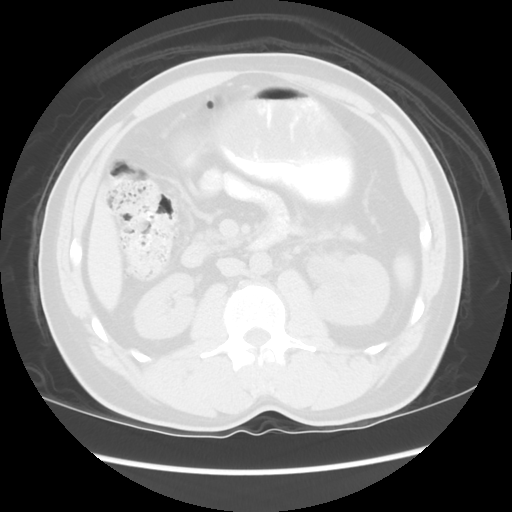
[im 57/82  soft-tissue]
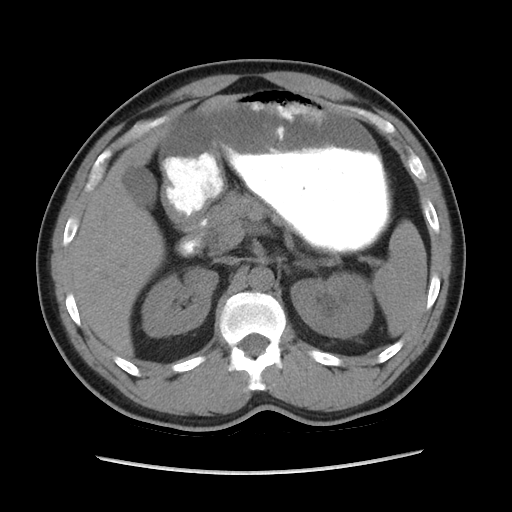
[im 57/82  lung]
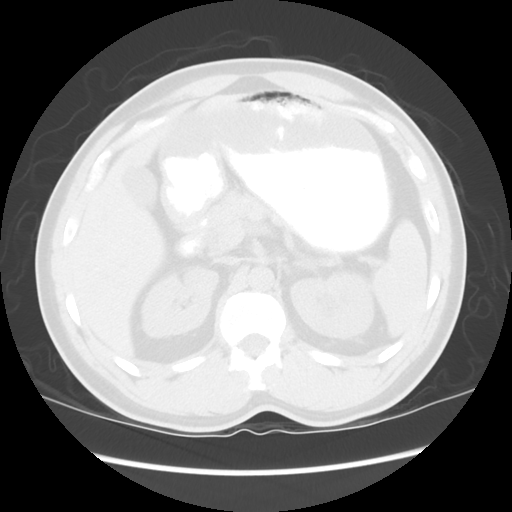
[im 65/82  soft-tissue]
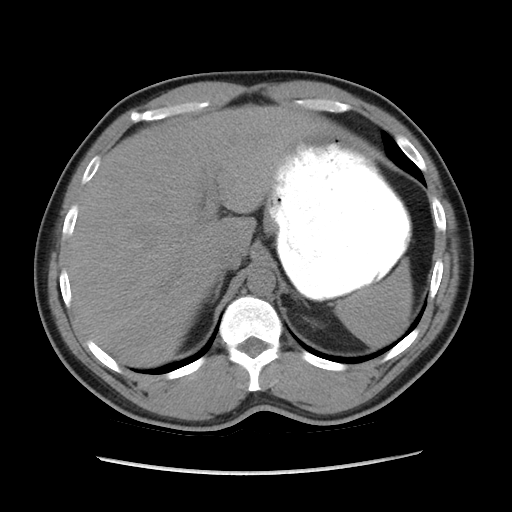
[im 65/82  lung]
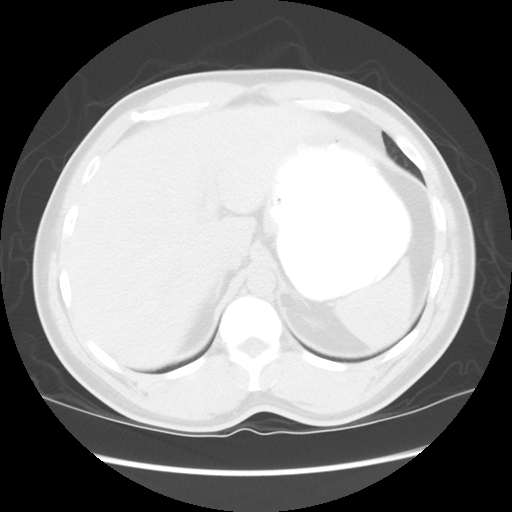
[im 73/82  soft-tissue]
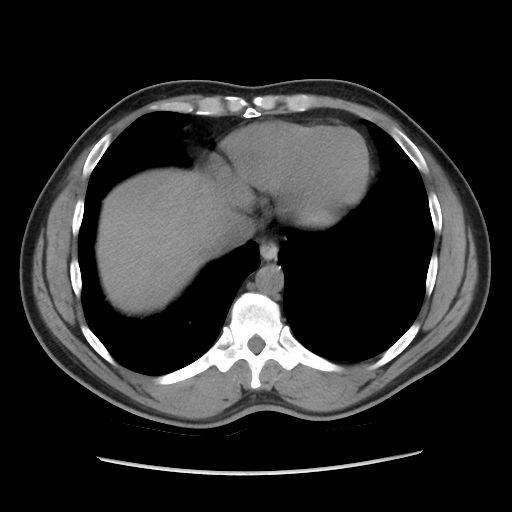
[im 73/82  lung]
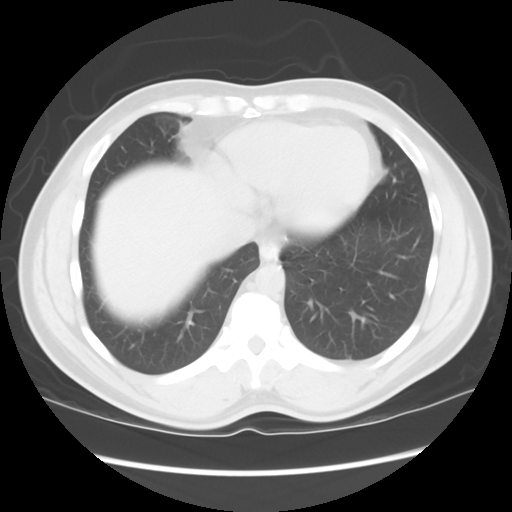
[im 73/82  bone]
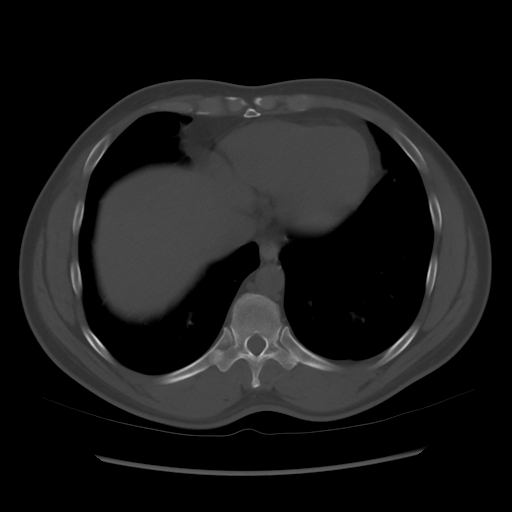

[Series 601: coronal body · coronal · 0.93mm/px · 1 of 117 slices shown, 2 images]
[im 39/117  soft-tissue]
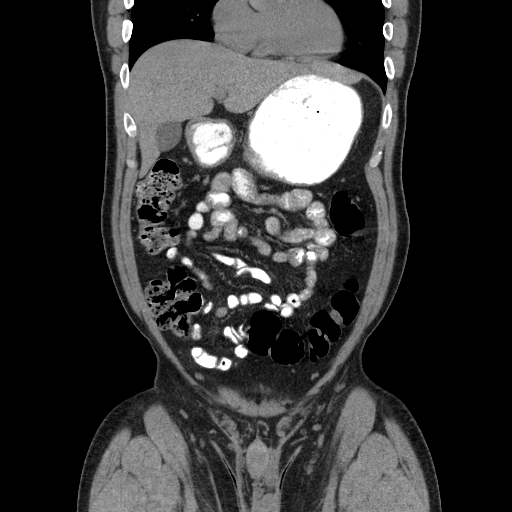
[im 39/117  bone]
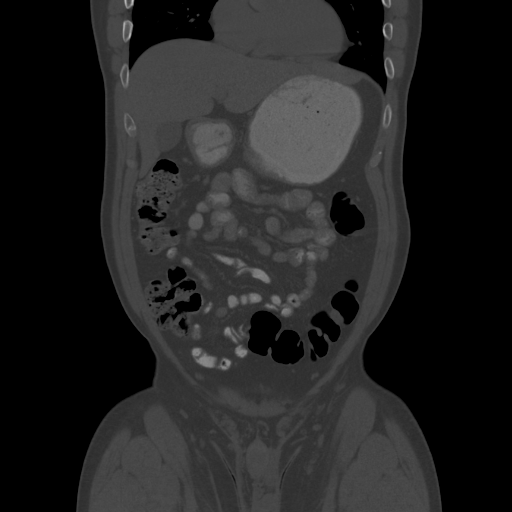

[10 of 36 positions shown; findings below may reference images not displayed]

FINDINGS: Lower chest: No acute findings.

Hepatobiliary: Tiny sub-cm low-attenuation lesion in the left
hepatic lobe is too small to characterize but most likely represents
a tiny cyst. No definite mass visualized on this unenhanced exam.
Gallbladder is unremarkable.

Pancreas: No mass or inflammatory process visualized on this
unenhanced exam.

Spleen:  Within normal limits in size.

Adrenals/Urinary tract: Adrenal glands and right kidney are normal
in appearance. Mild left hydronephrosis and perinephric stranding is
seen due to a 6 mm calculus in the proximal left ureter. No other
ureteral or bladder calculi identified.

Stomach/Bowel: No evidence of obstruction, inflammatory process, or
abnormal fluid collections. Normal appendix visualized.

Vascular/Lymphatic: No pathologically enlarged lymph nodes
identified. No evidence of abdominal aortic aneurysm.

Reproductive:  No mass or other significant abnormality.

Other:  None.

Musculoskeletal: No suspicious bone lesions identified. Old left
anterior rib fracture deformity incidentally noted.
IMPRESSION: Mild left hydronephrosis and perinephric stranding due to 6 mm
calculus in proximal left ureter.

## 2018-04-10 ENCOUNTER — Encounter: Payer: Self-pay | Admitting: Family Medicine

## 2018-04-10 ENCOUNTER — Ambulatory Visit: Payer: BC Managed Care – PPO | Admitting: Family Medicine

## 2018-04-10 VITALS — BP 122/80 | HR 95 | Temp 100.1°F | Wt 158.2 lb

## 2018-04-10 DIAGNOSIS — J209 Acute bronchitis, unspecified: Secondary | ICD-10-CM | POA: Diagnosis not present

## 2018-04-10 MED ORDER — AMOXICILLIN 875 MG PO TABS: 875.0000 mg | ORAL_TABLET | Freq: Two times a day (BID) | ORAL | 0 refills | Status: DC

## 2018-04-10 NOTE — Patient Instructions (Signed)
Take all the antibiotic if not totally back to normal when you finish give me a call

## 2018-04-10 NOTE — Progress Notes (Signed)
   Subjective:    Patient ID: [name], male    DOB: [birth year], 48 y.o.   MRN: [id number]  HPI He states that on Saturday he had the onset of cough, fever, chills, headache with myalgias.  The temperature the next morning was 101.5.  No sore throat, earache, sinus pain or pressure.  The cough became productive on Saturday.   Review of Systems     Objective:   Physical Exam Alert and in no distress. Tympanic membranes and canals are normal. Pharyngeal area is normal. Neck is supple without adenopathy or thyromegaly. Cardiac exam shows a regular sinus rhythm without murmurs or gallops. Lungs are clear to auscultation.        Assessment & Plan:  Acute bronchitis, unspecified organism - Plan: amoxicillin (AMOXIL) 875 MG tablet His symptoms came on too quickly to really be blamed on a viral infection I will therefore place him on Amoxil.  He is to call if not entirely better when he finishes the antibiotic.

## 2018-07-11 ENCOUNTER — Encounter: Payer: BC Managed Care – PPO | Admitting: Medical

## 2018-08-21 ENCOUNTER — Encounter: Payer: Self-pay | Admitting: Medical

## 2018-08-21 ENCOUNTER — Other Ambulatory Visit: Payer: Self-pay

## 2018-08-21 ENCOUNTER — Telehealth: Payer: Self-pay

## 2018-08-21 NOTE — Telephone Encounter (Signed)
D, please call

## 2018-08-21 NOTE — Telephone Encounter (Signed)
Patient scheduled 09-25-18 cl

## 2018-08-21 NOTE — Telephone Encounter (Signed)

## 2018-09-25 ENCOUNTER — Telehealth: Payer: Self-pay | Admitting: Medical

## 2018-09-25 ENCOUNTER — Encounter: Payer: Self-pay | Admitting: Medical

## 2018-09-25 ENCOUNTER — Other Ambulatory Visit: Payer: Self-pay

## 2018-09-25 ENCOUNTER — Ambulatory Visit: Payer: BC Managed Care – PPO | Admitting: Medical

## 2018-09-25 VITALS — BP 120/70 | HR 68 | Temp 98.4°F | Resp 16 | Ht 63.5 in | Wt 151.6 lb

## 2018-09-25 DIAGNOSIS — Z7189 Other specified counseling: Secondary | ICD-10-CM

## 2018-09-25 DIAGNOSIS — Z0000 Encounter for general adult medical examination without abnormal findings: Secondary | ICD-10-CM

## 2018-09-25 DIAGNOSIS — R3129 Other microscopic hematuria: Secondary | ICD-10-CM | POA: Diagnosis not present

## 2018-09-25 DIAGNOSIS — N201 Calculus of ureter: Secondary | ICD-10-CM | POA: Diagnosis not present

## 2018-09-25 DIAGNOSIS — J301 Allergic rhinitis due to pollen: Secondary | ICD-10-CM | POA: Diagnosis not present

## 2018-09-25 DIAGNOSIS — Z7185 Encounter for immunization safety counseling: Secondary | ICD-10-CM

## 2018-09-25 DIAGNOSIS — D751 Secondary polycythemia: Secondary | ICD-10-CM | POA: Insufficient documentation

## 2018-09-25 LAB — POCT URINALYSIS DIP (PROADVANTAGE DEVICE)
Bilirubin, UA: NEGATIVE
Glucose, UA: NEGATIVE mg/dL
Ketones, POC UA: NEGATIVE mg/dL
Leukocytes, UA: NEGATIVE
Nitrite, UA: NEGATIVE
Protein Ur, POC: NEGATIVE mg/dL
Specific Gravity, Urine: 1.025
Urobilinogen, Ur: NEGATIVE
pH, UA: 6 (ref 5.0–8.0)

## 2018-09-25 NOTE — Progress Notes (Signed)
Subjective:   HPI  [name] is a 48 y.o. male who presents for Chief Complaint  Patient presents with  . CPE    fasting CPE   eye exam 01-20    Patient Care Team: Gean Laursen, Kermit Baloavid S, PA-C as PCP - General (Family Medicine) Sees dentist Sees eye doctor  Concerns: Regarding elevated red cells in the past, no family history of thalassemia, no hx/o DVT or PE in family other than a clot in dad's leg related to concern for exposure in [place]/agent orange.  No other hx/o clot disorder in family.  [name] denies hx/o bleeding ,bruising, or other clots.   Microscopic hematuria in the past - no reported urinary c/o or hematuria.  Reviewed their medical, surgical, family, social, medication, and allergy history and updated chart as appropriate.  Regarding elevated lipids 2019, has mostly cut out meat but does eat occasional honey bun.  Exercising regularly.   Past Medical History:  Diagnosis Date  . Allergic rhinitis   . Asthma    rarely has problems with this as of 06/2017  . 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 Left ureteral stone 2017   managed by urology  . Renal insufficiency   . Wears glasses     Past Surgical History:  Procedure Laterality Date  . CYSTOSCOPY W/ URETERAL STENT PLACEMENT Left 01/12/2016   Procedure: CYSTOSCOPY WITH, STENT PLACEMENT;  Surgeon: Bjorn PippinJohn Wrenn, MD;  Location: [facility];  Service: Urology;  Laterality: Left;  . CYSTOSCOPY W/ URETERAL STENT REMOVAL Left 01/29/2016   Procedure: CYSTOSCOPY WITH STENT REMOVAL;  Surgeon: Bjorn PippinJohn Wrenn, MD;  [facility] [place];  Service: Urology;  Laterality: Left;  . CYSTOSCOPY WITH URETEROSCOPY Left 01/29/2016   Procedure: CYSTOSCOPY WITH URETEROSCOPY/ LASER LITHOTRIPSY;  Surgeon: Bjorn PippinJohn Wrenn, MD;  [facility] [place];  Service: Urology;  Laterality: Left;  . HOLMIUM LASER APPLICATION Left 01/29/2016   Procedure: HOLMIUM LASER  APPLICATION;  Surgeon: Bjorn PippinJohn Wrenn, MD;  [facility] [place];  Service: Urology;  Laterality: Left;  . NASAL FRACTURE SURGERY  1996    Social History   Socioeconomic History  . Marital status: Married    Spouse name: Not on file  . Number of children: Not on file  . Years of education: Not on file  . Highest education level: Not on file  Occupational History  . Occupation: social studies teacher grade 7    Employer: [employer]  Social Needs  . Financial resource strain: Not on file  . Food insecurity    Worry: Not on file    Inability: Not on file  . Transportation needs    Medical: Not on file    Non-medical: Not on file  Tobacco Use  . Smoking status: Never Smoker  . Smokeless tobacco: Never Used  Substance and Sexual Activity  . Alcohol use: Yes    Comment: occasional wine  . Drug use: No  . Sexual activity: Yes  Lifestyle  . Physical activity    Days per week: Not on file    Minutes per session: Not on file  . Stress: Not on file  Relationships  . Social Musicianconnections    Talks on phone: Not on file    Gets together: Not on file    Attends religious service: Not on file    Active member of club or organization: Not on file    Attends  meetings of clubs or organizations: Not on file    Relationship status: Not on file  . Intimate partner violence    Fear of current or ex partner: Not on file    Emotionally abused: Not on file    Physically abused: Not on file    Forced sexual activity: Not on file  Other Topics Concern  . Not on file  Social History Narrative   Married, wife is Verlon AuLeslie, has 2 biological, and currently 8 foster children (teenagers).  Exercise some with running, walking, some weights, golf, and stretching.  Teaches social studies, [employer] school, [employer].  08/2018    Family History  Problem Relation Age of Onset  . Diabetes Mother   . Cancer Mother        breast  . Diabetes Father   . Hypertension Father   .  Other Father        chemical exposure in [place]  . Stroke Neg Hx   . Heart disease Neg Hx   . Hyperlipidemia Neg Hx      Current Outpatient Medications:  Marland Kitchen.  Multiple Vitamins-Minerals (MULTIVITAMIN WITH MINERALS) tablet, Take 1 tablet by mouth daily., Disp: , Rfl:   Allergies  Allergen Reactions  . Shellfish Allergy Hives, Itching and Nausea And Vomiting       Review of Systems Constitutional: -fever, -chills, -sweats, -unexpected weight change, -decreased appetite, -fatigue Allergy: -sneezing, -itching, -congestion Dermatology: -changing moles, --rash, -lumps ENT: -runny nose, -ear pain, -sore throat, -hoarseness, -sinus pain, -teeth pain, - ringing in ears, -hearing loss, -nosebleeds Cardiology: -chest pain, -palpitations, -swelling, -difficulty breathing when lying flat, -waking up short of breath Respiratory: -cough, -shortness of breath, -difficulty breathing with exercise or exertion, -wheezing, -coughing up blood Gastroenterology: -abdominal pain, -nausea, -vomiting, -diarrhea, -constipation, -blood in stool, -changes in bowel movement, -difficulty swallowing or eating Hematology: -bleeding, -bruising  Musculoskeletal: -joint aches, -muscle aches, -joint swelling, -back pain, -neck pain, -cramping, -changes in gait Ophthalmology: denies vision changes, eye redness, itching, discharge Urology: -burning with urination, -difficulty urinating, -blood in urine, -urinary frequency, -urgency, -incontinence Neurology: -headache, -weakness, -tingling, -numbness, -memory loss, -falls, -dizziness Psychology: -depressed mood, -agitation, -sleep problems Male GU: no testicular mass, pain, no lymph nodes swollen, no swelling, no rash.     Objective:  BP 120/70   Pulse 68   Temp 98.4 F (36.9 C) (Oral)   Resp 16   Ht 5' 3.5" (1.613 m)   Wt 151 lb 9.6 oz (68.8 kg)   SpO2 98%   BMI 26.43 kg/m   General appearance: alert, no distress, WD/WN, African American male Skin: left  lateral lower chest wall with 6mm x 3mm raised oval brown lesion unchanged per patient, few other scattered benign appearing macules HEENT: normocephalic, conjunctiva/corneas normal, sclerae anicteric, PERRLA, EOMi, nares patent, no discharge or erythema, pharynx normal Oral cavity: MMM, tongue normal, teeth normal Neck: supple, no lymphadenopathy, no thyromegaly, no masses, normal ROM, no bruits Chest: non tender, normal shape and expansion Heart: RRR, normal S1, S2, no murmurs Lungs: CTA bilaterally, no wheezes, rhonchi, or rales Abdomen: +bs, soft, non tender, non distended, no masses, no hepatomegaly, no splenomegaly, no bruits Back: non tender, normal ROM, no scoliosis Musculoskeletal: upper extremities non tender, no obvious deformity, normal ROM throughout, lower extremities non tender, no obvious deformity, normal ROM throughout Extremities: no edema, no cyanosis, no clubbing Pulses: 2+ symmetric, upper and lower extremities, normal cap refill Neurological: alert, oriented x 3, CN2-12 intact, strength normal upper extremities and lower extremities,  sensation normal throughout, DTRs 2+ throughout, no cerebellar signs, gait normal Psychiatric: normal affect, behavior normal, pleasant  GU: normal male external genitalia,circumcised, nontender, no masses, no hernia, no lymphadenopathy Rectal: deferred   Assessment and Plan :   Encounter Diagnoses  Name Primary?  . Encounter for health maintenance examination in adult Yes  . Allergic rhinitis due to pollen, unspecified seasonality   . Vaccine counseling   . Ureteral stone   . Erythrocytosis     Physical exam - discussed and counseled on healthy lifestyle, diet, exercise, preventative care, vaccinations, sick and well care, proper use of emergency dept and after hours care, and addressed their concerns.    Health screening: See your eye doctor yearly for routine vision care. See your dentist yearly for routine dental care including  hygiene visits twice yearly.  Cancer screening Colonoscopy:  Check insurance for coverage at this age based on new American Cancer Society recommendations  Discussed PSA, prostate exam, and prostate cancer screening risks/benefits.     Vaccinations: Advised yearly influenza vaccine Up to date on Td  Reviewed labs from last year, labs today as below  UA showed microscopic hematuria - reviewed urine under microscope and no RBC seen, false +.    Paxon was seen today for cpe.  Diagnoses and all orders for this visit:  Encounter for health maintenance examination in adult -     Comprehensive metabolic panel -     CBC with Differential/Platelet -     Uric acid -     Lipid panel -     Iron  Allergic rhinitis due to pollen, unspecified seasonality  Vaccine counseling  Ureteral stone -     Uric acid  Erythrocytosis -     CBC with Differential/Platelet -     Iron   Follow-up pending labs, yearly for physical

## 2018-09-25 NOTE — Telephone Encounter (Signed)
Call and let him know that microscopic urine showed NO red blood cells.   Thus, it was a false positive result

## 2018-09-25 NOTE — Patient Instructions (Signed)
Thanks for trusting us with your health care and for coming in for a physical today.  Below are some general recommendations I have for you:  Yearly screenings See your eye doctor yearly for routine vision care. See your dentist yearly for routine dental care including hygiene visits twice yearly. See me here yearly for a routine physical and preventative care visit   Please follow up yearly for a physical.   Preventative Care for Adults - Male      MAINTAIN REGULAR HEALTH EXAMS:  A routine yearly physical is a good way to check in with your primary care provider about your health and preventive screening. It is also an opportunity to share updates about your health and any concerns you have, and receive a thorough all-over exam.   Most health insurance companies pay for at least some preventative services.  Check with your health plan for specific coverages.  WHAT PREVENTATIVE SERVICES DO MEN NEED?  Adult men should have their weight and blood pressure checked regularly.   Men age 35 and older should have their cholesterol levels checked regularly.  Beginning at age 50 and continuing to age 75, men should be screened for colorectal cancer.  Certain people may need continued testing until age 85.  Updating vaccinations is part of preventative care.  Vaccinations help protect against diseases such as the flu.  Osteoporosis is a disease in which the bones lose minerals and strength as we age. Men ages 65 and over should discuss this with their caregivers  Lab tests are generally done as part of preventative care to screen for anemia and blood disorders, to screen for problems with the kidneys and liver, to screen for bladder problems, to check blood sugar, and to check your cholesterol level.  Preventative services generally include counseling about diet, exercise, avoiding tobacco, drugs, excessive alcohol consumption, and sexually transmitted infections.    GENERAL  RECOMMENDATIONS FOR GOOD HEALTH:  Healthy diet:  Eat a variety of foods, including fruit, vegetables, animal or vegetable protein, such as meat, fish, chicken, and eggs, or beans, lentils, tofu, and grains, such as rice.  Drink plenty of water daily.  Decrease saturated fat in the diet, avoid lots of red meat, processed foods, sweets, fast foods, and fried foods.  Exercise:  Aerobic exercise helps maintain good heart health. At least 30-40 minutes of moderate-intensity exercise is recommended. For example, a brisk walk that increases your heart rate and breathing. This should be done on most days of the week.   Find a type of exercise or a variety of exercises that you enjoy so that it becomes a part of your daily life.  Examples are running, walking, swimming, water aerobics, and biking.  For motivation and support, explore group exercise such as aerobic class, spin class, Zumba, Yoga,or  martial arts, etc.    Set exercise goals for yourself, such as a certain weight goal, walk or run in a race such as a 5k walk/run.  Speak to your primary care provider about exercise goals.  Disease prevention:  If you smoke or chew tobacco, find out from your caregiver how to quit. It can literally save your life, no matter how long you have been a tobacco user. If you do not use tobacco, never begin.   Maintain a healthy diet and normal weight. Increased weight leads to problems with blood pressure and diabetes.   The Body Mass Index or BMI is a way of measuring how much of your body is   fat. Having a BMI above 27 increases the risk of heart disease, diabetes, hypertension, stroke and other problems related to obesity. Your caregiver can help determine your BMI and based on it develop an exercise and dietary program to help you achieve or maintain this important measurement at a healthful level.  High blood pressure causes heart and blood vessel problems.  Persistent high blood pressure should be treated  with medicine if weight loss and exercise do not work.   Fat and cholesterol leaves deposits in your arteries that can block them. This causes heart disease and vessel disease elsewhere in your body.  If your cholesterol is found to be high, or if you have heart disease or certain other medical conditions, then you may need to have your cholesterol monitored frequently and be treated with medication.   Ask if you should have a cardiac stress test if your history suggests this. A stress test is a test done on a treadmill that looks for heart disease. This test can find disease prior to there being a problem.  Osteoporosis is a disease in which the bones lose minerals and strength as we age. This can result in serious bone fractures. Risk of osteoporosis can be identified using a bone density scan. Men ages 65 and over should discuss this with their caregivers. Ask your caregiver whether you should be taking a calcium supplement and Vitamin D, to reduce the rate of osteoporosis.   Avoid drinking alcohol in excess (more than two drinks per day).  Avoid use of street drugs. Do not share needles with anyone. Ask for professional help if you need assistance or instructions on stopping the use of alcohol, cigarettes, and/or drugs.  Brush your teeth twice a day with fluoride toothpaste, and floss once a day. Good oral hygiene prevents tooth decay and gum disease. The problems can be painful, unattractive, and can cause other health problems. Visit your dentist for a routine oral and dental check up and preventive care every 6-12 months.   Look at your skin regularly.  Use a mirror to look at your back. Notify your caregivers of changes in moles, especially if there are changes in shapes, colors, a size larger than a pencil eraser, an irregular border, or development of new moles.  Safety:  Use seatbelts 100% of the time, whether driving or as a passenger.  Use safety devices such as hearing protection if you  work in environments with loud noise or significant background noise.  Use safety glasses when doing any work that could send debris in to the eyes.  Use a helmet if you ride a bike or motorcycle.  Use appropriate safety gear for contact sports.  Talk to your caregiver about gun safety.  Use sunscreen with a SPF (or skin protection factor) of 15 or greater.  Lighter skinned people are at a greater risk of skin cancer. Don't forget to also wear sunglasses in order to protect your eyes from too much damaging sunlight. Damaging sunlight can accelerate cataract formation.   Practice safe sex. Use condoms. Condoms are used for birth control and to help reduce the spread of sexually transmitted infections (or STIs).  Some of the STIs are gonorrhea (the clap), chlamydia, syphilis, trichomonas, herpes, HPV (human papilloma virus) and HIV (human immunodeficiency virus) which causes AIDS. The herpes, HIV and HPV are viral illnesses that have no cure. These can result in disability, cancer and death.   Keep carbon monoxide and smoke detectors in your home functioning   at all times. Change the batteries every 6 months or use a model that plugs into the wall.   Vaccinations:  Stay up to date with your tetanus shots and other required immunizations. You should have a booster for tetanus every 10 years. Be sure to get your flu shot every year, since 5%-20% of the [place] population comes down with the flu. The flu vaccine changes each year, so being vaccinated once is not enough. Get your shot in the fall, before the flu season peaks.   Other vaccines to consider:  Human Papilloma Virus or HPV causes cancer of the cervix, and other infections that can be transmitted from person to person. There is a vaccine for HPV, and males should get immunized between the ages of 11 and 26. It requires a series of 3 shots.   Pneumococcal vaccine to protect against certain types of pneumonia.  This is normally recommended for adults  age 65 or older.  However, adults younger than 49 years old with certain underlying conditions such as diabetes, heart or lung disease should also receive the vaccine.  Shingles vaccine to protect against Varicella Zoster if you are older than age 60, or younger than 49 years old with certain underlying illness.  If you have not had the Shingrix vaccine, please call your insurer to inquire about coverage for the Shingrix vaccine given in 2 doses.   Some insurers cover this vaccine after age 50, some cover this after age 60.  If your insurer covers this, then call to schedule appointment to have this vaccine here  Hepatitis A vaccine to protect against a form of infection of the liver by a virus acquired from food.  Hepatitis B vaccine to protect against a form of infection of the liver by a virus acquired from blood or body fluids, particularly if you work in health care.  If you plan to travel internationally, check with your local health department for specific vaccination recommendations.   What should I know about cancer screening? Many types of cancers can be detected early and may often be prevented. Lung Cancer  You should be screened every year for lung cancer if: ? You are a current smoker who has smoked for at least 30 years. ? You are a former smoker who has quit within the past 15 years.  Talk to your health care provider about your screening options, when you should start screening, and how often you should be screened.  Colorectal Cancer  Routine colorectal cancer screening usually begins at 50 years of age and should be repeated every 5-10 years until you are 49 years old. You may need to be screened more often if early forms of precancerous polyps or small growths are found. Your health care provider may recommend screening at an earlier age if you have risk factors for colon cancer.  Your health care provider may recommend using home test kits to check for hidden blood in  the stool.  A small camera at the end of a tube can be used to examine your colon (sigmoidoscopy or colonoscopy). This checks for the earliest forms of colorectal cancer.  Prostate and Testicular Cancer  Depending on your age and overall health, your health care provider may do certain tests to screen for prostate and testicular cancer.  Talk to your health care provider about any symptoms or concerns you have about testicular or prostate cancer.  Skin Cancer  Check your skin from head to toe regularly.  Tell your   health care provider about any new moles or changes in moles, especially if: ? There is a change in a mole's size, shape, or color. ? You have a mole that is larger than a pencil eraser.  Always use sunscreen. Apply sunscreen liberally and repeat throughout the day.  Protect yourself by wearing long sleeves, pants, a wide-brimmed hat, and sunglasses when outside.   

## 2018-09-25 NOTE — Telephone Encounter (Signed)
Patient notified of results.

## 2018-09-26 LAB — COMPREHENSIVE METABOLIC PANEL
ALT: 19 IU/L (ref 0–44)
AST: 16 IU/L (ref 0–40)
Albumin/Globulin Ratio: 1.5 (ref 1.2–2.2)
Albumin: 4.5 g/dL (ref 4.0–5.0)
Alkaline Phosphatase: 89 IU/L (ref 39–117)
BUN/Creatinine Ratio: 8 — ABNORMAL LOW (ref 9–20)
BUN: 10 mg/dL (ref 6–24)
Bilirubin Total: 0.7 mg/dL (ref 0.0–1.2)
CO2: 23 mmol/L (ref 20–29)
Calcium: 9.4 mg/dL (ref 8.7–10.2)
Chloride: 102 mmol/L (ref 96–106)
Creatinine, Ser: 1.33 mg/dL — ABNORMAL HIGH (ref 0.76–1.27)
GFR calc Af Amer: 72 mL/min/{1.73_m2} (ref >59)
GFR calc non Af Amer: 62 mL/min/{1.73_m2} (ref >59)
Globulin, Total: 3 g/dL (ref 1.5–4.5)
Glucose: 101 mg/dL — ABNORMAL HIGH (ref 65–99)
Potassium: 3.9 mmol/L (ref 3.5–5.2)
Sodium: 140 mmol/L (ref 134–144)
Total Protein: 7.5 g/dL (ref 6.0–8.5)

## 2018-09-26 LAB — LIPID PANEL
Chol/HDL Ratio: 4.9 ratio (ref 0.0–5.0)
Cholesterol, Total: 238 mg/dL — ABNORMAL HIGH (ref 100–199)
HDL: 49 mg/dL (ref >39)
LDL Calculated: 165 mg/dL — ABNORMAL HIGH (ref 0–99)
Triglycerides: 122 mg/dL (ref 0–149)
VLDL Cholesterol Cal: 24 mg/dL (ref 5–40)

## 2018-09-26 LAB — CBC WITH DIFFERENTIAL/PLATELET
Basophils Absolute: 0 10*3/uL (ref 0.0–0.2)
Basos: 1 %
EOS (ABSOLUTE): 0.2 10*3/uL (ref 0.0–0.4)
Eos: 4 %
Hematocrit: 52.2 % — ABNORMAL HIGH (ref 37.5–51.0)
Hemoglobin: 17.6 g/dL (ref 13.0–17.7)
Immature Grans (Abs): 0 10*3/uL (ref 0.0–0.1)
Immature Granulocytes: 0 %
Lymphocytes Absolute: 1.4 10*3/uL (ref 0.7–3.1)
Lymphs: 30 %
MCH: 25.7 pg — ABNORMAL LOW (ref 26.6–33.0)
MCHC: 33.7 g/dL (ref 31.5–35.7)
MCV: 76 fL — ABNORMAL LOW (ref 79–97)
Monocytes Absolute: 0.5 10*3/uL (ref 0.1–0.9)
Monocytes: 9 %
Neutrophils Absolute: 2.8 10*3/uL (ref 1.4–7.0)
Neutrophils: 56 %
Platelets: 250 10*3/uL (ref 150–450)
RBC: 6.84 x10E6/uL — ABNORMAL HIGH (ref 4.14–5.80)
RDW: 14.5 % (ref 11.6–15.4)
WBC: 4.9 10*3/uL (ref 3.4–10.8)

## 2018-09-26 LAB — URIC ACID: Uric Acid: 7.5 mg/dL (ref 3.7–8.6)

## 2018-09-26 LAB — IRON: Iron: 156 ug/dL (ref 38–169)

## 2018-09-28 ENCOUNTER — Other Ambulatory Visit: Payer: Self-pay | Admitting: Medical

## 2018-09-28 DIAGNOSIS — D751 Secondary polycythemia: Secondary | ICD-10-CM

## 2018-09-30 LAB — B12 AND FOLATE PANEL
Folate: 8.5 ng/mL (ref >3.0)
Vitamin B-12: 575 pg/mL (ref 232–1245)

## 2018-09-30 LAB — SPECIMEN STATUS REPORT

## 2018-09-30 LAB — ERYTHROPOIETIN: Erythropoietin: 6.4 m[IU]/mL (ref 2.6–18.5)

## 2018-10-02 ENCOUNTER — Other Ambulatory Visit: Payer: Self-pay

## 2018-10-02 ENCOUNTER — Other Ambulatory Visit: Payer: Self-pay | Admitting: Medical

## 2018-10-02 DIAGNOSIS — D751 Secondary polycythemia: Secondary | ICD-10-CM

## 2018-10-02 DIAGNOSIS — R718 Other abnormality of red blood cells: Secondary | ICD-10-CM

## 2018-10-04 ENCOUNTER — Telehealth: Payer: Self-pay | Admitting: Medical

## 2018-10-04 NOTE — Telephone Encounter (Signed)
Pt called and states that he has not heard from hematology And wanted to make you aware, told him that it may take them a little longer to get back to him but he wanted to make sure that you were aware

## 2018-10-04 NOTE — Telephone Encounter (Signed)
Referral has been put in [facility].

## 2018-10-04 NOTE — Telephone Encounter (Signed)
Ok, please make sure we have started referral process to Hematology

## 2020-02-19 ENCOUNTER — Other Ambulatory Visit: Payer: Self-pay

## 2020-02-19 ENCOUNTER — Encounter: Payer: Self-pay | Admitting: Medical

## 2020-02-19 ENCOUNTER — Ambulatory Visit: Payer: BC Managed Care – PPO | Admitting: Medical

## 2020-02-19 VITALS — BP 140/90 | HR 92 | Ht 64.5 in | Wt 167.2 lb

## 2020-02-19 DIAGNOSIS — N200 Calculus of kidney: Secondary | ICD-10-CM | POA: Diagnosis not present

## 2020-02-19 DIAGNOSIS — R110 Nausea: Secondary | ICD-10-CM | POA: Diagnosis not present

## 2020-02-19 DIAGNOSIS — Z87442 Personal history of urinary calculi: Secondary | ICD-10-CM

## 2020-02-19 LAB — POCT URINALYSIS DIP (PROADVANTAGE DEVICE)
Bilirubin, UA: NEGATIVE
Glucose, UA: NEGATIVE mg/dL
Ketones, POC UA: NEGATIVE mg/dL
Leukocytes, UA: NEGATIVE
Nitrite, UA: NEGATIVE
Protein Ur, POC: NEGATIVE mg/dL
Specific Gravity, Urine: 1.005
Urobilinogen, Ur: 0.2
pH, UA: 5.5 (ref 5.0–8.0)

## 2020-02-19 MED ORDER — OXYCODONE-ACETAMINOPHEN 5-325 MG PO TABS: 1.0000 | ORAL_TABLET | ORAL | 0 refills | Status: DC | PRN

## 2020-02-19 MED ORDER — TAMSULOSIN HCL 0.4 MG PO CAPS: 0.4000 mg | ORAL_CAPSULE | Freq: Every day | ORAL | 1 refills | Status: DC

## 2020-02-19 NOTE — Progress Notes (Signed)
Subjective:  [name] is a 49 y.o. male who presents for Chief Complaint  Patient presents with  . Nephrolithiasis    Having lower abdominal pain and pelvic pain      Here for possible stone.  He has history of kidney stones twice before.  Started last week with abdominal pain, intermittent and a small piece of stone did pass last week but has had some ongoing intermittent pain.  Using ibuprofen today for pain.   Urine actually clear the last few days. Hydrating well.  One prior stone passed spontaneously and one prior stone had to be removed surgically.   No fever, no vomiting, no diarrhea, no back pain, no chills or body aches.  He does note prior uric acid stone.  No other aggravating or relieving factors.    No other c/o.  Past Medical History:  Diagnosis Date  . Allergic rhinitis   . Asthma    rarely has problems with this as of 06/2017  . 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 Left ureteral stone 2017   managed by urology  . Renal insufficiency   . Wears glasses    Current Outpatient Medications on File Prior to Visit  Medication Sig Dispense Refill  . Multiple Vitamins-Minerals (MULTIVITAMIN WITH MINERALS) tablet Take 1 tablet by mouth daily. (Patient not taking: Reported on 02/19/2020)     No current facility-administered medications on file prior to visit.    The following portions of the patient's history were reviewed and updated as appropriate: allergies, current medications, past family history, past medical history, past social history, past surgical history and problem list.  ROS Otherwise as in subjective above  Objective: BP 140/90   Pulse 92   Ht 5' 4.5" (1.638 m)   Wt 167 lb 3.2 oz (75.8 kg)   SpO2 96%   BMI 28.26 kg/m   General appearance: alert, no distress, well developed, well nourished Abdomen: +bs, soft, mild left lower abdominal tendnerss, otherwise non tender, non distended, no masses,  no hepatomegaly, no splenomegaly Back: nontender Pulses: 2+ radial pulses, 2+ pedal pulses, normal cap refill Ext: no edema   Assessment: Encounter Diagnoses  Name Primary?  . Renal stone Yes  . Nausea   . Personal history of kidney stones      Plan: Labs today, begin Flomax, pain medication prn.  Avoid NSAID for now until we know kidney marker is ok.  May end up needing abdominal ultrasound if Creatinine elevated.  C/t to rest,hydrate, and get re-evaluated if much worse or not improving.  I reviewed 2017 CT abdomen pelvis + for stone.  Consider allopurinol once things settle down for prevention, work on limiting high sugar foods.    [name] was seen today for nephrolithiasis.  Diagnoses and all orders for this visit:  Renal stone -     Basic metabolic panel -     POCT Urinalysis DIP (Proadvantage Device)  Nausea -     Basic metabolic panel  Personal history of kidney stones -     Basic metabolic panel  Other orders -     tamsulosin (FLOMAX) 0.4 MG CAPS capsule; Take 1 capsule (0.4 mg total) by mouth daily. -     oxyCODONE-acetaminophen (PERCOCET/ROXICET) 5-325 MG tablet; Take 1 tablet by mouth every 4 (four) hours as needed for severe pain.    Follow up: pending labs

## 2020-02-20 LAB — BASIC METABOLIC PANEL
BUN/Creatinine Ratio: 6 — ABNORMAL LOW (ref 9–20)
BUN: 14 mg/dL (ref 6–24)
CO2: 24 mmol/L (ref 20–29)
Calcium: 9.4 mg/dL (ref 8.7–10.2)
Chloride: 104 mmol/L (ref 96–106)
Creatinine, Ser: 2.23 mg/dL — ABNORMAL HIGH (ref 0.76–1.27)
GFR calc Af Amer: 38 mL/min/{1.73_m2} — ABNORMAL LOW (ref >59)
GFR calc non Af Amer: 33 mL/min/{1.73_m2} — ABNORMAL LOW (ref >59)
Glucose: 105 mg/dL — ABNORMAL HIGH (ref 65–99)
Potassium: 4 mmol/L (ref 3.5–5.2)
Sodium: 142 mmol/L (ref 134–144)

## 2020-02-25 ENCOUNTER — Other Ambulatory Visit: Payer: Self-pay | Admitting: Urology

## 2020-02-25 DIAGNOSIS — N201 Calculus of ureter: Secondary | ICD-10-CM

## 2020-02-27 NOTE — Progress Notes (Signed)
Talked with patient. Instructions given. Arrival time 74. Covid test on 02/28/20 informed to be quarantine until Monday. Wife will be the driver. Cl. Liquids until [phone]

## 2020-02-28 ENCOUNTER — Other Ambulatory Visit (HOSPITAL_COMMUNITY)
Admission: RE | Admit: 2020-02-28 | Discharge: 2020-02-28 | Disposition: A | Discharge status: Home / Self Care | Payer: BC Managed Care – PPO | Source: Ambulatory Visit | Attending: Urology | Admitting: Urology

## 2020-02-28 DIAGNOSIS — Z20822 Contact with and (suspected) exposure to covid-19: Secondary | ICD-10-CM | POA: Diagnosis not present

## 2020-02-28 DIAGNOSIS — Z01812 Encounter for preprocedural laboratory examination: Secondary | ICD-10-CM | POA: Insufficient documentation

## 2020-02-28 LAB — SARS CORONAVIRUS 2 (TAT 6-24 HRS): SARS Coronavirus 2: NEGATIVE

## 2020-03-03 ENCOUNTER — Other Ambulatory Visit: Payer: Self-pay

## 2020-03-03 ENCOUNTER — Encounter (HOSPITAL_BASED_OUTPATIENT_CLINIC_OR_DEPARTMENT_OTHER)
Admission: RE | Disposition: A | Discharge status: Home / Self Care | Payer: Self-pay | Source: Home / Self Care | Attending: Urology

## 2020-03-03 ENCOUNTER — Ambulatory Visit (HOSPITAL_COMMUNITY): Payer: BC Managed Care – PPO

## 2020-03-03 ENCOUNTER — Ambulatory Visit (HOSPITAL_BASED_OUTPATIENT_CLINIC_OR_DEPARTMENT_OTHER)
Admission: RE | Admit: 2020-03-03 | Discharge: 2020-03-03 | Disposition: A | Discharge status: Home / Self Care | Payer: BC Managed Care – PPO | Attending: Urology | Admitting: Urology

## 2020-03-03 ENCOUNTER — Encounter (HOSPITAL_BASED_OUTPATIENT_CLINIC_OR_DEPARTMENT_OTHER): Payer: Self-pay | Admitting: Urology

## 2020-03-03 DIAGNOSIS — Z841 Family history of disorders of kidney and ureter: Secondary | ICD-10-CM | POA: Insufficient documentation

## 2020-03-03 DIAGNOSIS — Z538 Procedure and treatment not carried out for other reasons: Secondary | ICD-10-CM | POA: Diagnosis not present

## 2020-03-03 DIAGNOSIS — Z87442 Personal history of urinary calculi: Secondary | ICD-10-CM | POA: Diagnosis not present

## 2020-03-03 DIAGNOSIS — Z09 Encounter for follow-up examination after completed treatment for conditions other than malignant neoplasm: Secondary | ICD-10-CM | POA: Diagnosis not present

## 2020-03-03 DIAGNOSIS — N201 Calculus of ureter: Secondary | ICD-10-CM

## 2020-03-03 HISTORY — PX: EXTRACORPOREAL SHOCK WAVE LITHOTRIPSY: SHX1557

## 2020-03-03 SURGERY — LITHOTRIPSY, ESWL
Anesthesia: LOCAL | Laterality: Left

## 2020-03-03 MED ORDER — DIPHENHYDRAMINE HCL 25 MG PO CAPS
25.0000 mg | ORAL_CAPSULE | ORAL | Status: AC
  Administered 2020-03-03: 25 mg via ORAL

## 2020-03-03 MED ORDER — SODIUM CHLORIDE 0.9 % IV SOLN: INTRAVENOUS | Status: DC

## 2020-03-03 MED ORDER — CIPROFLOXACIN HCL 500 MG PO TABS
500.0000 mg | ORAL_TABLET | ORAL | Status: AC
  Administered 2020-03-03: 500 mg via ORAL

## 2020-03-03 MED ORDER — DIPHENHYDRAMINE HCL 25 MG PO CAPS
ORAL_CAPSULE | ORAL | Status: AC
  Filled 2020-03-03: qty 1

## 2020-03-03 MED ORDER — DIAZEPAM 5 MG PO TABS
ORAL_TABLET | ORAL | Status: AC
  Filled 2020-03-03: qty 2

## 2020-03-03 MED ORDER — DIAZEPAM 5 MG PO TABS
10.0000 mg | ORAL_TABLET | ORAL | Status: AC
  Administered 2020-03-03: 10 mg via ORAL

## 2020-03-03 MED ORDER — CIPROFLOXACIN HCL 500 MG PO TABS
ORAL_TABLET | ORAL | Status: AC
  Filled 2020-03-03: qty 1

## 2020-03-03 NOTE — H&P (Signed)
: 10/10/18: Patient with past history of renal stones. He underwent left URS in 2017 for obstructing calculus. Today he presents today following recent stone event. He began to experience left flank and abdominal pain approximatly 10 days ago. He states that pain became progressively worse during the week last week. He had intermittent episodes of hematuria at the time. He states that he passed a stone about 2 days ago. He was able to capture this, but did not bring it to the office with him. He did show me a picture, however. He states that since passage of stone material, symptoms have resolved. He denies any ongoing flank or abdominal pain. He denies difficulties voiding of exacerbation of lower urinary tract symptoms. No dysuria, gross hematuria, fevers, chills, nausea, or vomiting. Prior stones were thought to be uric acid composition and radiolucent on prior imaging.    10/27/18: Pressure of above-noted history. He returns today for follow-up. He states that he did pass additional stone material approximately 5 days ago. However, he had no associated pain or symptoms with this. He denies any interval episodes of flank pain or abdominal pain. He states that voiding symptoms remain at baseline. He denies dysuria or interval episodes of gross hematuria. No complaints of fever, chills, nausea, or vomiting.   02/20/20: HE presents today with left lower flank and quadrant pain . This began about a week ago. He reports that he did pass a stone that was small a few weeks ago. This pain comes and goes and is associated with slight nausea at times. HE denies fevers, chills and vomiting. He denies gross hematuria. He feels he is emptying his bladder well.     ALLERGIES: Shellfish - Nausea    MEDICATIONS: Daily Multiple Vitamin  Flomax 0.4 mg capsule  Oxycodone Hcl  Vitamin B-12     GU PSH: Cysto Remove Stent FB Sim - 2017 Cysto Uretero Lithotripsy, Left - 2017 Cystoscopy Insert Stent, Left - 2017      NON-GU PSH: Nose Surgery (Unspecified)     GU PMH: Acute kidney failure, Cr was 2.15 on 11/8. - 2017 Ureteral calculus, 6x40m left proximal stone that is radiolucent. - 2017 Renal calculus    NON-GU PMH: None   FAMILY HISTORY: Kidney Stones - Runs in Family   SOCIAL HISTORY: Marital Status: Married Preferred Language: English; Ethnicity: Not Hispanic Or Latino; Race: Black or African American Current Smoking Status: Patient has never smoked.  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  Gastrointestinal (Upper):   Patient denies nausea, vomiting, and indigestion/ heartburn.  Gastrointestinal (Lower):   Patient reports constipation. Patient denies diarrhea.  Constitutional:   Patient denies fever, night sweats, weight loss, and fatigue.  Skin:   Patient denies skin rash/ lesion and itching.  Eyes:   Patient denies blurred vision and double vision.  Ears/ Nose/ Throat:   Patient denies sore throat and sinus problems.  Hematologic/Lymphatic:   Patient denies swollen glands and easy bruising.  Cardiovascular:   Patient denies chest pains and leg swelling.  Respiratory:   Patient denies cough and shortness of breath.  Endocrine:   Patient denies excessive thirst.  Musculoskeletal:   Patient denies back pain and joint pain.  Neurological:   Patient denies headaches and dizziness.  Psychologic:   Patient denies depression and anxiety.   Notes: pt c/o pelvic pain    VITAL SIGNS:      02/20/2020 01:01 PM  Weight 165 lb / 74.84 kg  Height 64 in / 162.56 cm  BP 154/94 mmHg  Heart Rate 96 /min  Temperature 98.4 F / 36.8 C  BMI 28.3 kg/m   GU PHYSICAL EXAMINATION:    Anus and Perineum: No  hemorrhoids. No anal stenosis. No rectal fissure, no anal fissure. No edema, no dimple, no perineal tenderness, no anal tenderness.  Scrotum: No lesions. No edema. No cysts. No warts.  Epididymides: Right: no spermatocele, no masses, no cysts, no tenderness, no induration, no enlargement. Left: no spermatocele, no masses, no cysts, no tenderness, no induration, no enlargement.  Testes: No tenderness, no swelling, no enlargement left testes. No tenderness, no swelling, no enlargement right testes. Normal location left testes. Normal location right testes. No mass, no cyst, no varicocele, no hydrocele left testes. No mass, no cyst, no varicocele, no hydrocele right testes.  Urethral Meatus: Normal size. No lesion, no wart, no discharge, no polyp. Normal location.  Penis: Circumcised, no warts, no cracks. No dorsal Peyronie's plaques, no left corporal Peyronie's plaques, no right corporal Peyronie's plaques, no scarring, no warts. No balanitis, no meatal stenosis.  Prostate: 40 gram or 2+ size. Left lobe normal consistency, right lobe normal consistency. Symmetrical lobes. No prostate nodule. Left lobe no tenderness, right lobe no tenderness.  Seminal Vesicles: Nonpalpable.  Sphincter Tone: Normal sphincter. No rectal tenderness. No rectal mass.    MULTI-SYSTEM PHYSICAL EXAMINATION:    Constitutional: Well-nourished. No physical deformities. Normally developed. Good grooming.  Neck: Neck symmetrical, not swollen. Normal tracheal position.  Respiratory: No labored breathing, no use of accessory muscles.   Cardiovascular: Normal temperature, normal extremity pulses, no swelling, no varicosities.  Lymphatic: No enlargement of neck, axillae, groin.  Skin: No paleness, no jaundice, no cyanosis. No lesion, no ulcer, no rash.  Neurologic / Psychiatric: Oriented to time, oriented to place, oriented to person. No depression, no anxiety, no agitation.  Gastrointestinal: No mass, no tenderness, no rigidity, non  obese abdomen.  Eyes: Normal conjunctivae. Normal eyelids.  Ears, Nose, Mouth, and Throat: Left ear no scars, no lesions, no masses. Right ear no scars, no lesions, no masses. Nose no scars, no lesions, no masses. Normal hearing. Normal lips.  Musculoskeletal: Normal gait and station of head and neck.     PAST DATA REVIEW: None   PROCEDURES:         C.T. Urogram - [id number]      Patient confirmed No Neulasta OnPro Device.          KUB - [id number]  A single view of the abdomen is obtained. There is a nonobstructing bowel gas pattern. Bilateral renal shadows are visualized. There is an opacity at the level of L3 within the expected course and the anatomical position of the left proximal ureter. This is congruent with this size, shape, and location noted within the CT scan.       Patient confirmed No Neulasta OnPro Device.           Urinalysis w/Scope Dipstick Dipstick Cont'd Micro  Color: Yellow Bilirubin: Neg mg/dL WBC/hpf: 0 - 5/hpf  Appearance: Clear Ketones: Neg mg/dL RBC/hpf: 3 - 10/hpf  Specific Gravity: 1.020 Blood: 2+ ery/uL Bacteria: Rare (0-9/hpf)  pH: 6.0 Protein: Neg mg/dL Cystals: NS (Not Seen)  Glucose:  Neg mg/dL Urobilinogen: 0.2 mg/dL Casts: NS (Not Seen)    Nitrites: Neg Trichomonas: Not Present    Leukocyte Esterase: Neg leu/uL Mucous: Present      Epithelial Cells: NS (Not Seen)      Yeast: NS (Not Seen)      Sperm: Not Present    ASSESSMENT: None   PLAN:            Medications New Meds: Cephalexin 500 mg tablet 1 tablet PO BID   #14  0 Refill(s)  Ondansetron Hcl 4 mg tablet 1 tablet PO Q 6 H PRN for nausea  #20  0 Refill(s)            Orders Labs CULTURE, URINE  X-Rays: C.T. Stone Protocol Without Contrast - LEft lower abdomina pain, flank pain    KUB - For consideration of ESWL, does not need to see me after xray.   X-Ray Notes: History:  Hematuria: Yes/No  Patient to see MD after exam: Yes/No  Previous exam: CT / IVP/ US/ KUB/  None  When:  Where:  Diabetic: Yes/ No  BUN/ Creatine:  Date of last BUN Creatinine:  Weight in pounds:  Allergy- Contrasts/ Shellfish: Yes/ No  Conflicting diabetic meds: Yes/ No  Oral contrast and instructions given to patient:   Prior Authorization #: [id number] valid 02/20/20 thru 08/17/20            Schedule Return Visit/Planned Activity: 2 Weeks - Office Visit, Extender          Document Letter(s):  Created for Patient: Clinical Summary         Notes:   Imaging today is concerning for left proximal 58m proximal stone. We discussed options in detail that include ESWL, and URS as well as MET. He would like to proceed with ESWL. I discussed the risks of the procedure including the risk for injury or hematoma to the kidney, the risk for failure of procedure to clear stone, the risk of increasing pain or discomfort in the risk of infection. He verbalized understanding of these risks and wished to proceed. He has a pain medication at this time that was given to him by his primary care doctor. He reports he has not been using it often however, I am unable to send in another prescription at this time due to a current 1 that was sent in yesterday. I would be happy to refill this medication for him when he needs it refilled while he is passing the stone until we can get him in for his follow-up procedure. I also advised to begin cephalexin, as his last stone he developed a severe infection with. Zofran sent to his pharmacy as well in case he becomes nauseated. I will follow up with final radiology read. And I will place paper work for ESWL.

## 2020-03-03 NOTE — Op Note (Signed)
Procedure cancelled, stone not visualized

## 2020-03-03 NOTE — Interval H&P Note (Signed)
History and Physical Interval Note:  03/03/2020 7:54 AM  [name]  has presented today for surgery, with the diagnosis of left proximal ureteral stone.  The various methods of treatment have been discussed with the patient and family. After consideration of risks, benefits and other options for treatment, the patient has consented to  Procedure(s): LEFT EXTRACORPOREAL SHOCK WAVE LITHOTRIPSY (ESWL) (Left) as a surgical intervention.  The patient's history has been reviewed, patient examined, no change in status, stable for surgery.  I have reviewed the patient's chart and labs.  Questions were answered to the patient's satisfaction.     Belva Agee

## 2020-03-04 ENCOUNTER — Encounter (HOSPITAL_BASED_OUTPATIENT_CLINIC_OR_DEPARTMENT_OTHER): Payer: Self-pay | Admitting: Urology

## 2020-03-12 ENCOUNTER — Other Ambulatory Visit: Payer: Self-pay | Admitting: Medical

## 2020-11-14 ENCOUNTER — Telehealth: Payer: Self-pay | Admitting: Internal Medicine

## 2020-11-14 NOTE — Telephone Encounter (Signed)
Left message for pt to call back to schedule CPE 

## 2021-07-22 ENCOUNTER — Ambulatory Visit: Payer: Self-pay | Admitting: Medical

## 2021-07-22 ENCOUNTER — Encounter: Payer: Self-pay | Admitting: Medical

## 2021-07-22 VITALS — BP 110/64 | HR 73 | Ht 64.0 in | Wt 153.6 lb

## 2021-07-22 DIAGNOSIS — Z1211 Encounter for screening for malignant neoplasm of colon: Secondary | ICD-10-CM

## 2021-07-22 DIAGNOSIS — Z13220 Encounter for screening for lipoid disorders: Secondary | ICD-10-CM

## 2021-07-22 DIAGNOSIS — Z125 Encounter for screening for malignant neoplasm of prostate: Secondary | ICD-10-CM

## 2021-07-22 DIAGNOSIS — Z23 Encounter for immunization: Secondary | ICD-10-CM

## 2021-07-22 DIAGNOSIS — L989 Disorder of the skin and subcutaneous tissue, unspecified: Secondary | ICD-10-CM

## 2021-07-22 DIAGNOSIS — Z0000 Encounter for general adult medical examination without abnormal findings: Secondary | ICD-10-CM

## 2021-07-22 NOTE — Progress Notes (Signed)
Subjective:   HPI  [name] is a 51 y.o. male who presents for Chief Complaint  Patient presents with   fasting cpe    Fasting cpe, no concerns. Needs form filled out, due to colonoscopy and then tetanus and shingles    Patient Care Team: Maxxon Schwanke, Cleda Mccreedy as PCP - General (Family Medicine) Sees dentist Sees eye doctor  Concerns: Doing fine.  Needs form completed for foster care.  Has had 11 foster children over the years.  Taking a break currently.  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 until age 36--  no seizure since   Left ureteral stone 2017   managed by urology   Renal insufficiency    Wears glasses     Past Surgical History:  Procedure Laterality Date   CYSTOSCOPY W/ URETERAL STENT PLACEMENT Left 01/12/2016   Procedure: CYSTOSCOPY WITH, STENT PLACEMENT;  Surgeon: Bjorn Pippin, MD;  Location: [facility];  Service: Urology;  Laterality: Left;   CYSTOSCOPY W/ URETERAL STENT REMOVAL Left 01/29/2016   Procedure: CYSTOSCOPY WITH STENT REMOVAL;  Surgeon: Bjorn Pippin, MD;  Location: [facility];  Service: Urology;  Laterality: Left;   CYSTOSCOPY WITH URETEROSCOPY Left 01/29/2016   Procedure: CYSTOSCOPY WITH URETEROSCOPY/ LASER LITHOTRIPSY;  Surgeon: Bjorn Pippin, MD;  Location: [facility];  Service: Urology;  Laterality: Left;   EXTRACORPOREAL SHOCK WAVE LITHOTRIPSY Left 03/03/2020   Procedure: LEFT EXTRACORPOREAL SHOCK WAVE LITHOTRIPSY (ESWL);  Surgeon: Belva Agee, MD;  Location: [facility];  Service: Urology;  Laterality: Left;   HOLMIUM LASER APPLICATION Left 01/29/2016   Procedure: HOLMIUM LASER APPLICATION;   Surgeon: Bjorn Pippin, MD;  Location: [facility];  Service: Urology;  Laterality: Left;   NASAL FRACTURE SURGERY  1996    Family History  Problem Relation Age of Onset   Diabetes Mother    Cancer Mother        breast   Diabetes Father    Hypertension Father    Other Father        chemical exposure in [place]   Dementia Father    Hypertension Brother    Stroke Neg Hx    Heart disease Neg Hx    Hyperlipidemia Neg Hx      Current Outpatient Medications:    Multiple Vitamins-Minerals (MULTIVITAMIN WITH MINERALS) tablet, Take 1 tablet by mouth daily., Disp: , Rfl:   Allergies  Allergen Reactions   Shellfish Allergy Hives, Itching and Nausea And Vomiting     Review of Systems Constitutional: -fever, -chills, -sweats, -unexpected weight change, -decreased appetite, -fatigue Allergy: -sneezing, -itching, -congestion Dermatology: -changing moles, --rash, -lumps ENT: -runny nose, -ear pain, -sore throat, -hoarseness, -sinus pain, -teeth pain, - ringing in ears, -hearing loss, -nosebleeds Cardiology: -chest pain, -palpitations, -swelling, -difficulty breathing when lying flat, -waking up short of breath Respiratory: -cough, -shortness of breath, -difficulty breathing with exercise or exertion, -wheezing, -coughing up blood Gastroenterology: -abdominal pain, -nausea, -vomiting, -diarrhea, -constipation, -blood in stool, -changes in bowel movement, -difficulty swallowing or eating Hematology: -bleeding, -bruising  Musculoskeletal: -joint aches, -muscle aches, -joint swelling, -back pain, -neck pain, -cramping, -changes in gait Ophthalmology: denies vision  changes, eye redness, itching, discharge Urology: -burning with urination, -difficulty urinating, -blood in urine, -urinary frequency, -urgency, -incontinence Neurology: -headache, -weakness, -tingling, -numbness, -memory loss, -falls, -dizziness Psychology: -depressed mood, -agitation, -sleep problems Male GU: no  testicular mass, pain, no lymph nodes swollen, no swelling, no rash.     07/22/2021    8:50 AM 09/25/2018    9:07 AM 07/07/2017    8:11 AM 05/27/2015    2:23 PM  Depression screen PHQ 2/9  Decreased Interest 0 0 0 0  Down, Depressed, Hopeless 0 0 0 0  PHQ - 2 Score 0 0 0 0        Objective:  BP 110/64   Pulse 73   Ht 5\' 4"  (1.626 m)   Wt 153 lb 9.6 oz (69.7 kg)   BMI 26.37 kg/m   General appearance: alert, no distress, WD/WN, African American male Skin: left lateral abdomen mid way between ribs and pelvis with approx 1cm x 14mm oval raised stuck on appearing lesion.   Skin surrounding is irritated from his excoriations.  Left upper lateral arm with 76mm brown raised lesion unchanged for years per patient, no other worrisome lesions HEENT: normocephalic, conjunctiva/corneas normal, sclerae anicteric, PERRLA, EOMi Neck: supple, no lymphadenopathy, no thyromegaly, no masses, normal ROM, no bruits Chest: non tender, normal shape and expansion Heart: RRR, normal S1, S2, no murmurs Lungs: CTA bilaterally, no wheezes, rhonchi, or rales Abdomen: +bs, soft, non tender, non distended, no masses, no hepatomegaly, no splenomegaly, no bruits Back: non tender, normal ROM, no scoliosis Musculoskeletal: upper extremities non tender, no obvious deformity, normal ROM throughout, lower extremities non tender, no obvious deformity, normal ROM throughout Extremities: no edema, no cyanosis, no clubbing Pulses: 2+ symmetric, upper and lower extremities, normal cap refill Neurological: alert, oriented x 3, CN2-12 intact, strength normal upper extremities and lower extremities, sensation normal throughout, DTRs 2+ throughout, no cerebellar signs, gait normal Psychiatric: normal affect, behavior normal, pleasant  GU: normal male external genitalia,circumcised, nontender, no masses, no hernia, no lymphadenopathy Rectal: declined   Assessment and Plan :   Encounter Diagnoses  Name Primary?   Encounter for  health maintenance examination in adult Yes   Need for Tdap vaccination    Screen for colon cancer    Screening for prostate cancer    Screening for lipid disorders    Skin lesion     This visit was a preventative care visit, also known as wellness visit or routine physical.   Topics typically include healthy lifestyle, diet, exercise, preventative care, vaccinations, sick and well care, proper use of emergency dept and after hours care, as well as other concerns.     Recommendations: Continue to return yearly for your annual wellness and preventative care visits.  This gives 1m a chance to discuss healthy lifestyle, exercise, vaccinations, review your chart record, and perform screenings where appropriate.  I recommend you see your eye doctor yearly for routine vision care.  I recommend you see your dentist yearly for routine dental care including hygiene visits twice yearly.   Vaccination recommendations were reviewed Immunization History  Administered Date(s) Administered   Tdap 06/08/2011    Counseled on the Tdap (tetanus, diptheria, and acellular pertussis) vaccine.  Vaccine information sheet given. Tdap vaccine given after consent obtained.    Screening for cancer: Colon cancer screening: We will refer you for Cologuard   We discussed PSA, prostate exam, and prostate cancer screening risks/benefits.     Skin cancer screening: Check your skin regularly  for new changes, growing lesions, or other lesions of concern Come in for evaluation if you have skin lesions of concern.  Lung cancer screening: If you have a greater than 20 pack year history of tobacco use, then you may qualify for lung cancer screening with a chest CT scan.   Please call your insurance company to inquire about coverage for this test.  We currently don't have screenings for other cancers besides breast, cervical, colon, and lung cancers.  If you have a strong family history of cancer or have other  cancer screening concerns, please let me know.    Bone health: Get at least 150 minutes of aerobic exercise weekly Get weight bearing exercise at least once weekly Bone density test:  A bone density test is an imaging test that uses a type of X-ray to measure the amount of calcium and other minerals in your bones. The test may be used to diagnose or screen you for a condition that causes weak or thin bones (osteoporosis), predict your risk for a broken bone (fracture), or determine how well your osteoporosis treatment is working. The bone density test is recommended for females 65 and older, or females or males <65 if certain risk factors such as thyroid disease, long term use of steroids such as for asthma or rheumatological issues, vitamin D deficiency, estrogen deficiency, family history of osteoporosis, self or family history of fragility fracture in first degree relative.    Heart health: Get at least 150 minutes of aerobic exercise weekly Limit alcohol It is important to maintain a healthy blood pressure and healthy cholesterol numbers  Heart disease screening: Screening for heart disease includes screening for blood pressure, fasting lipids, glucose/diabetes screening, BMI height to weight ratio, reviewed of smoking status, physical activity, and diet.    Goals include blood pressure 120/80 or less, maintaining a healthy lipid/cholesterol profile, preventing diabetes or keeping diabetes numbers under good control, not smoking or using tobacco products, exercising most days per week or at least 150 minutes per week of exercise, and eating healthy variety of fruits and vegetables, healthy oils, and avoiding unhealthy food choices like fried food, fast food, high sugar and high cholesterol foods.    Other tests may possibly include EKG test, CT coronary calcium score, echocardiogram, exercise treadmill stress test.   Consider CT coronary calcium score, he will consider  Consider  updated EKG, he declines today   Medical care options: I recommend you continue to seek care here first for routine care.  We try really hard to have available appointments Monday through Friday daytime hours for sick visits, acute visits, and physicals.  Urgent care should be used for after hours and weekends for significant issues that cannot wait till the next day.  The emergency department should be used for significant potentially life-threatening emergencies.  The emergency department is expensive, can often have long wait times for less significant concerns, so try to utilize primary care, urgent care, or telemedicine when possible to avoid unnecessary trips to the emergency department.  Virtual visits and telemedicine have been introduced since the pandemic started in 2020, and can be convenient ways to receive medical care.  We offer virtual appointments as well to assist you in a variety of options to seek medical care.   Advanced Directives: I recommend you consider completing a Health Care Power of Attorney and Living Will.   These documents respect your wishes and help alleviate burdens on your loved ones if you were to become  terminally ill or be in a position to need those documents enforced.    You can complete Advanced Directives yourself, have them notarized, then have copies made for our office, for you and for anybody you feel should have them in safe keeping.  Or, you can have an attorney prepare these documents.   If you haven't updated your Last Will and Testament in a while, it may be worthwhile having an attorney prepare these documents together and save on some costs.       Separate significant issues discussed: Skin lesion - discussed his left abdomen lesions.  He may want to return for excision.  Discussed shave biopsy vs elliptical excision given the constant irritation  Jorgen was seen today for fasting cpe.  Diagnoses and all orders for this visit:  Encounter for  health maintenance examination in adult -     Comprehensive metabolic panel -     CBC with Differential/Platelet -     Lipid panel -     PSA -     Urinalysis  Need for Tdap vaccination  Screen for colon cancer -     Cologuard  Screening for prostate cancer -     PSA  Screening for lipid disorders -     Lipid panel  Skin lesion    Follow-up pending labs, yearly for physical

## 2021-07-22 NOTE — Addendum Note (Signed)
Addended by: Herminio Commons A on: 07/22/2021 09:33 AM   Modules accepted: Orders

## 2021-07-23 LAB — CBC WITH DIFFERENTIAL/PLATELET
Basophils Absolute: 0 10*3/uL (ref 0.0–0.2)
Basos: 1 %
EOS (ABSOLUTE): 0.1 10*3/uL (ref 0.0–0.4)
Eos: 2 %
Hematocrit: 50.5 % (ref 37.5–51.0)
Hemoglobin: 17.4 g/dL (ref 13.0–17.7)
Immature Grans (Abs): 0 10*3/uL (ref 0.0–0.1)
Immature Granulocytes: 0 %
Lymphocytes Absolute: 1.2 10*3/uL (ref 0.7–3.1)
Lymphs: 24 %
MCH: 26.4 pg — ABNORMAL LOW (ref 26.6–33.0)
MCHC: 34.5 g/dL (ref 31.5–35.7)
MCV: 77 fL — ABNORMAL LOW (ref 79–97)
Monocytes Absolute: 0.4 10*3/uL (ref 0.1–0.9)
Monocytes: 8 %
Neutrophils Absolute: 3.5 10*3/uL (ref 1.4–7.0)
Neutrophils: 65 %
Platelets: 256 10*3/uL (ref 150–450)
RBC: 6.59 x10E6/uL — ABNORMAL HIGH (ref 4.14–5.80)
RDW: 14.2 % (ref 11.6–15.4)
WBC: 5.3 10*3/uL (ref 3.4–10.8)

## 2021-07-23 LAB — COMPREHENSIVE METABOLIC PANEL
ALT: 25 IU/L (ref 0–44)
AST: 22 IU/L (ref 0–40)
Albumin/Globulin Ratio: 1.6 (ref 1.2–2.2)
Albumin: 4.5 g/dL (ref 3.8–4.9)
Alkaline Phosphatase: 93 IU/L (ref 44–121)
BUN/Creatinine Ratio: 15 (ref 9–20)
BUN: 18 mg/dL (ref 6–24)
Bilirubin Total: 0.6 mg/dL (ref 0.0–1.2)
CO2: 20 mmol/L (ref 20–29)
Calcium: 9.4 mg/dL (ref 8.7–10.2)
Chloride: 104 mmol/L (ref 96–106)
Creatinine, Ser: 1.24 mg/dL (ref 0.76–1.27)
Globulin, Total: 2.9 g/dL (ref 1.5–4.5)
Glucose: 99 mg/dL (ref 70–99)
Potassium: 4.3 mmol/L (ref 3.5–5.2)
Sodium: 141 mmol/L (ref 134–144)
Total Protein: 7.4 g/dL (ref 6.0–8.5)
eGFR: 70 mL/min/{1.73_m2} (ref >59)

## 2021-07-23 LAB — LIPID PANEL
Chol/HDL Ratio: 4 ratio (ref 0.0–5.0)
Cholesterol, Total: 195 mg/dL (ref 100–199)
HDL: 49 mg/dL (ref >39)
LDL Chol Calc (NIH): 135 mg/dL — ABNORMAL HIGH (ref 0–99)
Triglycerides: 59 mg/dL (ref 0–149)
VLDL Cholesterol Cal: 11 mg/dL (ref 5–40)

## 2021-07-23 LAB — URINALYSIS
Bilirubin, UA: NEGATIVE
Glucose, UA: NEGATIVE
Ketones, UA: NEGATIVE
Leukocytes,UA: NEGATIVE
Nitrite, UA: NEGATIVE
Protein,UA: NEGATIVE
RBC, UA: NEGATIVE
Specific Gravity, UA: 1.008 (ref 1.005–1.030)
Urobilinogen, Ur: 0.2 mg/dL (ref 0.2–1.0)
pH, UA: 6 (ref 5.0–7.5)

## 2021-07-23 LAB — PSA: Prostate Specific Ag, Serum: 0.9 ng/mL (ref 0.0–4.0)

## 2021-07-28 ENCOUNTER — Other Ambulatory Visit: Payer: Self-pay | Admitting: Medical

## 2021-11-04 ENCOUNTER — Encounter: Payer: Self-pay | Admitting: Internal Medicine

## 2022-12-03 ENCOUNTER — Other Ambulatory Visit: Payer: Self-pay | Admitting: Medical

## 2022-12-03 DIAGNOSIS — Z1211 Encounter for screening for malignant neoplasm of colon: Secondary | ICD-10-CM

## 2022-12-03 DIAGNOSIS — Z1212 Encounter for screening for malignant neoplasm of rectum: Secondary | ICD-10-CM

## 2023-03-03 ENCOUNTER — Encounter: Payer: Self-pay | Admitting: Internal Medicine
